# Patient Record
Sex: Female | Born: 1960 | Race: White | Hispanic: No | Smoking: Former smoker
Health system: Southern US, Community
[De-identification: ages and names within clinical notes are randomized; demographics above are authoritative.]

## PROBLEM LIST (undated history)

## (undated) DIAGNOSIS — E7800 Pure hypercholesterolemia, unspecified: Secondary | ICD-10-CM

## (undated) DIAGNOSIS — K219 Gastro-esophageal reflux disease without esophagitis: Secondary | ICD-10-CM

## (undated) DIAGNOSIS — I10 Essential (primary) hypertension: Secondary | ICD-10-CM

## (undated) DIAGNOSIS — L509 Urticaria, unspecified: Secondary | ICD-10-CM

## (undated) DIAGNOSIS — M1990 Unspecified osteoarthritis, unspecified site: Secondary | ICD-10-CM

## (undated) DIAGNOSIS — Z889 Allergy status to unspecified drugs, medicaments and biological substances status: Secondary | ICD-10-CM

## (undated) DIAGNOSIS — Z8489 Family history of other specified conditions: Secondary | ICD-10-CM

## (undated) HISTORY — DX: Essential (primary) hypertension: I10

## (undated) HISTORY — DX: Urticaria, unspecified: L50.9

## (undated) HISTORY — PX: CYSTECTOMY: SUR359

## (undated) HISTORY — DX: Pure hypercholesterolemia, unspecified: E78.00

## (undated) HISTORY — DX: Gastro-esophageal reflux disease without esophagitis: K21.9

## (undated) HISTORY — PX: CLAVICLE SURGERY: SHX598

## (undated) HISTORY — PX: TUBAL LIGATION: SHX77

## (undated) HISTORY — PX: SINOSCOPY: SHX187

---

## 1980-01-26 HISTORY — PX: TUBAL LIGATION: SHX77

## 2014-11-14 ENCOUNTER — Ambulatory Visit: Payer: Self-pay | Admitting: Allergy and Immunology

## 2015-03-04 ENCOUNTER — Ambulatory Visit (INDEPENDENT_AMBULATORY_CARE_PROVIDER_SITE_OTHER): Payer: BLUE CROSS/BLUE SHIELD | Admitting: Allergy and Immunology

## 2015-03-04 ENCOUNTER — Encounter: Payer: Self-pay | Admitting: Allergy and Immunology

## 2015-03-04 VITALS — BP 104/60 | HR 94 | Temp 97.6°F | Resp 18 | Ht 67.87 in | Wt 197.0 lb

## 2015-03-04 DIAGNOSIS — J310 Chronic rhinitis: Secondary | ICD-10-CM

## 2015-03-04 DIAGNOSIS — R059 Cough, unspecified: Secondary | ICD-10-CM

## 2015-03-04 DIAGNOSIS — L299 Pruritus, unspecified: Secondary | ICD-10-CM | POA: Diagnosis not present

## 2015-03-04 DIAGNOSIS — R05 Cough: Secondary | ICD-10-CM

## 2015-03-04 MED ORDER — HYDROXYZINE HCL 10 MG PO TABS: 10.0000 mg | ORAL_TABLET | Freq: Every day | ORAL | Status: DC

## 2015-03-04 MED ORDER — FLUTICASONE PROPIONATE 50 MCG/ACT NA SUSP
NASAL | Status: DC
Start: 2015-03-04 — End: 2015-09-01

## 2015-03-04 NOTE — Patient Instructions (Addendum)
Take Home Sheet  1. Avoidance: of fragranced soaps/lotions/detergents.   2. Antihistamine: Zyrtec 10mg  by mouth once daily for runny nose or itching.   If needed add Atarax 10mg  at bedtime for itching.  3. Nasal Spray: Flonase one spray(s) each nostril once daily for stuffy nose or drainage.   4.  Moisturize skin 2-4 times daily with Vanicream or Cerave cream (tub).   Consider squeezable vaseline.   5. Nasal Saline wash each evening at shower time.  6.  Keep written diary of increased itching ---environment, exposure, ingestion and activity.  7. Follow up Visit: for skin testing off antihistamines (Zyrtec, Benadryl, Atarax) 72 hours prior to appointment.   Websites that have reliable Patient information: 1. American Academy of Asthma, Allergy, & Immunology: www.aaaai.org 2. Food Allergy Network: www.foodallergy.org 3. Mothers of Asthmatics: www.aanma.org 4. [employer]: DiningCalendar.de 5. American College of Allergy, Asthma, & Immunology: https://robertson.info/ or www.acaai.org

## 2015-03-04 NOTE — Progress Notes (Signed)
NEW PATIENT NOTE  RE: Deborah Walter MRN: [id number] DOB: Nov 09, 1960 [facility] [address] [place], [place] [address] Date of Office Visit: 03/04/2015  Dear Shanon Brow B. Scotty Court, MD:  I had the pleasure of seeing Deborah Walter today in initial evaluation as you recall-- Subjective:  Deborah Walter is a 55 y.o. female who presents today for Pruritis and Nasal Congestion  Assessment:   1. Cough associated with chronic rhinitis/postnasal drip, suspected allergic etiology.  2. Recent pruritus associated with rash and significant xerosis.  3. Question of allergic reaction with clear, skin today.  4.      Current avoidance of amoxicillin, though less likely hypersensitivity. 5.      Patient report of sinusitis episodes. Plan:   Meds ordered this encounter  Medications  . hydrOXYzine (ATARAX/VISTARIL) 10 MG tablet    Sig: Take 1 tablet (10 mg total) by mouth daily.    Dispense:  30 tablet    Refill:  3  . fluticasone (FLONASE) 50 MCG/ACT nasal spray    Sig: Use one to two sprays each nostril once daily.    Dispense:  16 g    Refill:  5   Patient Instructions  1. Avoidance: of fragranced soaps/lotions/detergents. 2. Antihistamine: Zyrtec 10mg  by mouth once daily for runny nose or itching.   If needed add Atarax 10mg  at bedtime for itching. 3. Nasal Spray: Flonase one spray(s) each nostril once daily for stuffy nose or drainage.  4.  Moisturize skin 2-4 times daily with Vanicream or Cerave cream (tub).   Consider squeezable vaseline. 5. Nasal Saline wash each evening at shower time. 6.  Keep written diary of increased itching ---environment, exposure, ingestion and activity. 7. Follow up Visit: for skin testing off antihistamines (Zyrtec, Benadryl, Atarax) 72 hours prior to appointment and obtain records of sinus CT scan.  HPI: Deborah Walter presents with concerns for allergies.  She describes recurring history of rhinorrhea, congestion, sneezing, postnasal drip,  intermittently associated with cough and possible rare shortness of breath, though none currently.  She denies difficulty breathing, chest congestion or tightness or history of recurring chest symptoms.  She has a great concern for skin difficulties since late July when she completed a course of amoxicillin (Augmentin) for sinus infection.  Within 36 hours, off medication, she began with increasing itching, redness, swelling at ears, facial irritation and therefore was evaluated by her primary care physician who administered prednisone course.  She followed up 2 days later because of persisting difficulty and received cortisone injection and took Benadryl.  She also decided to manage knee discomfort with Advil and actually thought that this resolved her skin concerns.  She denies associated swelling of her lips, tongue and throat nor associated dysphagia, difficulty breathing or shortness of breath.  She typically uses Dove soap and Gain detergent but has changed since her skin concerns to Aveeno soap/lotion, and Charlie's detergent.  She has no restrictions to her diet and often is a sandwich person--hamburger, fries, steak and cheese, pepperoni mushroom pizza,Italian pepperoni and salami subs.  She has no recollection of tick bites.  She does recall a sinus CT scan 4 years ago and thought there was sinus abnormalities.  Denies exercise induced difficulty, nocturnal concerns or missed work.  She describes headache, difficulty associated with peanut and almond exposures-- therefore avoidance.  Denies ED or Urgent care visits.  Medical History: Past Medical History  Diagnosis Date  . Hypertension   . Elevated cholesterol   . Acid reflux  Surgical History: Past Surgical History  Procedure Laterality Date  . Clavicle surgery Right Early 1990  . Cystectomy  Early 1990    Bottom of spine   Family History: Family History  Problem Relation Age of Onset  . Asthma Father   . Asthma Sister   . Allergic  rhinitis Neg Hx   . Eczema Neg Hx   . Immunodeficiency Neg Hx   . Urticaria Neg Hx    Social History: Social History  . Marital Status: Single    Spouse Name: N/A  . Number of Children: 2  . Years of Education: N/A   Social History Main Topics  . Smoking status: Former Smoker    Quit date: 09/14/2014  . Smokeless tobacco: Never Used  . Alcohol Use: Not on file  . Drug Use: Not on file  . Sexual Activity: Not on file   Social History Narrative  .  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  History of 2 [employer] and divorced.     Deborah Walter has a current medication list which includes the following prescription(s): fluticasone, hydroxyzine, lisinopril-hydrochlorothiazide, lovastatin, oxycodone-acetaminophen, and ranitidine.   Drug Allergies: Allergies  Allergen Reactions  . Peanut-Containing Drug Products     Headaches  . Amoxicillin Swelling    Facial swelling and severe itching  . Other     ALMONDS.  CAUSE MIGRAINE HEADACHE   Environmental History: Deborah Walter lives in a 57 year old house 432 years, with wood floors, central air and heat dogs and cats.  Stuffed mattress non-feather pillow and comforter without humidifier, or smokers.  Review of Systems  Constitutional: Negative for fever, weight loss and malaise/fatigue.  HENT: Positive for congestion. Negative for ear pain, hearing loss, nosebleeds and sore throat.        See HPI.   Eyes: Negative for blurred vision, double vision, discharge and redness.       Corrective eyeglasses lenses for 10 years.  Respiratory: Negative for shortness of breath.        Denies history of bronchitis and pneumonia.  Gastrointestinal: Negative for heartburn, nausea, vomiting, abdominal pain, diarrhea and constipation.  Genitourinary: Negative.  Negative for hematuria.  Musculoskeletal: Negative for myalgias and joint pain.  Skin: Negative for itching and rash.       See HPI,  currently symptom-free.  Neurological: Negative.  Negative for dizziness, seizures, weakness and headaches.  Endo/Heme/Allergies: Positive for environmental allergies.       Denies sensitivity to aspirin, NSAIDs, stinging insects, latex, jewelry and cosmetics.    Objective:   Filed Vitals:   03/04/15 0846  BP: 104/60  Pulse: 94  Temp: 97.6 F (36.4 C)  Resp: 18   SpO2 Readings from Last 1 Encounters:  03/04/15 97%   Physical Exam  Constitutional: She is well-developed, well-nourished, and in no distress.  HENT:  Head: Atraumatic.  Right Ear: Tympanic membrane and ear canal normal.  Left Ear: Tympanic membrane and ear canal normal.  Nose: Mucosal edema present. No rhinorrhea. No epistaxis.  Mouth/Throat: Oropharynx is clear and moist and mucous membranes are normal. No oropharyngeal exudate, posterior oropharyngeal edema or posterior oropharyngeal erythema.  Eyes: Conjunctivae are normal.  Neck: Neck supple.  Cardiovascular: Normal rate, S1 normal and S2 normal.   No murmur heard. Pulmonary/Chest: Effort normal. She has no wheezes. She has no rhonchi. She has no rales.  Abdominal: Soft. Normal appearance and bowel sounds are normal.  Musculoskeletal: She exhibits no edema.  Lymphadenopathy:    She has no cervical adenopathy.  Neurological: She is alert.  Skin: Skin is warm and intact. No rash noted. No cyanosis. Nails show no clubbing.  Generalized dryness without dermatographia.   Diagnostics: Spirometry:  FVC 3.40--103%, FEV1 2.83--101%. Skin testing:  Deferred.     M. Ishmael Holter, MD   cc: Deloria Lair, MD

## 2015-03-05 ENCOUNTER — Encounter: Payer: Self-pay | Admitting: Allergy and Immunology

## 2015-04-04 ENCOUNTER — Other Ambulatory Visit: Payer: Self-pay

## 2015-04-04 MED ORDER — HYDROXYZINE HCL 10 MG PO TABS: 10.0000 mg | ORAL_TABLET | Freq: Every day | ORAL | Status: DC

## 2015-04-24 ENCOUNTER — Ambulatory Visit (INDEPENDENT_AMBULATORY_CARE_PROVIDER_SITE_OTHER): Payer: BLUE CROSS/BLUE SHIELD | Admitting: Otolaryngology

## 2015-04-24 DIAGNOSIS — J320 Chronic maxillary sinusitis: Secondary | ICD-10-CM

## 2015-04-24 DIAGNOSIS — J321 Chronic frontal sinusitis: Secondary | ICD-10-CM | POA: Diagnosis not present

## 2015-04-24 DIAGNOSIS — J342 Deviated nasal septum: Secondary | ICD-10-CM

## 2015-04-25 ENCOUNTER — Other Ambulatory Visit (INDEPENDENT_AMBULATORY_CARE_PROVIDER_SITE_OTHER): Payer: Self-pay | Admitting: Otolaryngology

## 2015-04-25 DIAGNOSIS — J328 Other chronic sinusitis: Secondary | ICD-10-CM

## 2015-04-30 ENCOUNTER — Ambulatory Visit (HOSPITAL_COMMUNITY): Admission: RE | Admit: 2015-04-30 | Payer: BLUE CROSS/BLUE SHIELD | Source: Ambulatory Visit

## 2015-05-26 ENCOUNTER — Ambulatory Visit (HOSPITAL_COMMUNITY): Payer: BLUE CROSS/BLUE SHIELD

## 2015-06-03 ENCOUNTER — Ambulatory Visit (HOSPITAL_COMMUNITY)
Admission: RE | Admit: 2015-06-03 | Discharge: 2015-06-03 | Disposition: A | Discharge status: Home / Self Care | Payer: BLUE CROSS/BLUE SHIELD | Source: Ambulatory Visit | Attending: Otolaryngology | Admitting: Otolaryngology

## 2015-06-03 ENCOUNTER — Ambulatory Visit: Payer: BLUE CROSS/BLUE SHIELD | Admitting: Allergy and Immunology

## 2015-06-03 DIAGNOSIS — M272 Inflammatory conditions of jaws: Secondary | ICD-10-CM | POA: Insufficient documentation

## 2015-06-03 DIAGNOSIS — K0889 Other specified disorders of teeth and supporting structures: Secondary | ICD-10-CM | POA: Diagnosis not present

## 2015-06-03 DIAGNOSIS — J320 Chronic maxillary sinusitis: Secondary | ICD-10-CM | POA: Diagnosis not present

## 2015-06-03 DIAGNOSIS — J328 Other chronic sinusitis: Secondary | ICD-10-CM | POA: Diagnosis present

## 2015-06-03 DIAGNOSIS — J342 Deviated nasal septum: Secondary | ICD-10-CM | POA: Diagnosis not present

## 2015-06-05 ENCOUNTER — Ambulatory Visit (INDEPENDENT_AMBULATORY_CARE_PROVIDER_SITE_OTHER): Payer: BLUE CROSS/BLUE SHIELD | Admitting: Otolaryngology

## 2015-06-05 DIAGNOSIS — J320 Chronic maxillary sinusitis: Secondary | ICD-10-CM

## 2015-06-05 DIAGNOSIS — J342 Deviated nasal septum: Secondary | ICD-10-CM

## 2015-06-05 DIAGNOSIS — J322 Chronic ethmoidal sinusitis: Secondary | ICD-10-CM | POA: Diagnosis not present

## 2015-06-05 DIAGNOSIS — J321 Chronic frontal sinusitis: Secondary | ICD-10-CM | POA: Diagnosis not present

## 2015-07-18 ENCOUNTER — Other Ambulatory Visit: Payer: Self-pay

## 2015-07-18 ENCOUNTER — Telehealth: Payer: Self-pay | Admitting: Allergy and Immunology

## 2015-07-18 MED ORDER — HYDROXYZINE HCL 10 MG PO TABS: 10.0000 mg | ORAL_TABLET | Freq: Every day | ORAL | Status: DC

## 2015-07-18 NOTE — Telephone Encounter (Signed)
Sent in hydroxazine

## 2015-07-18 NOTE — Telephone Encounter (Signed)
Pt called and said that she needed to have the rx for the itching, she does not rember the name of it [facility].  [phone].

## 2015-07-24 ENCOUNTER — Other Ambulatory Visit: Payer: Self-pay | Admitting: Otolaryngology

## 2015-08-26 ENCOUNTER — Encounter (HOSPITAL_BASED_OUTPATIENT_CLINIC_OR_DEPARTMENT_OTHER): Payer: Self-pay | Admitting: *Deleted

## 2015-08-29 ENCOUNTER — Other Ambulatory Visit: Payer: Self-pay

## 2015-08-29 ENCOUNTER — Encounter (HOSPITAL_COMMUNITY)
Admission: RE | Admit: 2015-08-29 | Discharge: 2015-08-29 | Disposition: A | Discharge status: Home / Self Care | Payer: BLUE CROSS/BLUE SHIELD | Source: Ambulatory Visit | Attending: Otolaryngology | Admitting: Otolaryngology

## 2015-08-29 DIAGNOSIS — Z6830 Body mass index (BMI) 30.0-30.9, adult: Secondary | ICD-10-CM | POA: Diagnosis not present

## 2015-08-29 DIAGNOSIS — B49 Unspecified mycosis: Secondary | ICD-10-CM | POA: Diagnosis not present

## 2015-08-29 DIAGNOSIS — J342 Deviated nasal septum: Secondary | ICD-10-CM | POA: Diagnosis present

## 2015-08-29 DIAGNOSIS — J3489 Other specified disorders of nose and nasal sinuses: Secondary | ICD-10-CM | POA: Diagnosis not present

## 2015-08-29 DIAGNOSIS — I10 Essential (primary) hypertension: Secondary | ICD-10-CM | POA: Diagnosis not present

## 2015-08-29 DIAGNOSIS — J343 Hypertrophy of nasal turbinates: Secondary | ICD-10-CM | POA: Diagnosis not present

## 2015-08-29 DIAGNOSIS — Z87891 Personal history of nicotine dependence: Secondary | ICD-10-CM | POA: Diagnosis not present

## 2015-08-29 DIAGNOSIS — E669 Obesity, unspecified: Secondary | ICD-10-CM | POA: Diagnosis not present

## 2015-08-29 DIAGNOSIS — K219 Gastro-esophageal reflux disease without esophagitis: Secondary | ICD-10-CM | POA: Diagnosis not present

## 2015-08-29 DIAGNOSIS — Z79899 Other long term (current) drug therapy: Secondary | ICD-10-CM | POA: Diagnosis not present

## 2015-08-29 DIAGNOSIS — J328 Other chronic sinusitis: Secondary | ICD-10-CM | POA: Diagnosis not present

## 2015-08-29 LAB — BASIC METABOLIC PANEL
Anion gap: 7 (ref 5–15)
BUN: 44 mg/dL — ABNORMAL HIGH (ref 6–20)
CALCIUM: 9.5 mg/dL (ref 8.9–10.3)
CO2: 25 mmol/L (ref 22–32)
CREATININE: 1.04 mg/dL — AB (ref 0.44–1.00)
Chloride: 104 mmol/L (ref 101–111)
GFR calc Af Amer: >60 mL/min (ref >60)
GFR, EST NON AFRICAN AMERICAN: 59 mL/min — AB (ref >60)
GLUCOSE: 105 mg/dL — AB (ref 65–99)
Potassium: 4.3 mmol/L (ref 3.5–5.1)
Sodium: 136 mmol/L (ref 135–145)

## 2015-08-29 NOTE — Progress Notes (Signed)
Abnormal lad reviewed by Dr Rodman Comp   No further treatment needed

## 2015-09-01 ENCOUNTER — Encounter (HOSPITAL_BASED_OUTPATIENT_CLINIC_OR_DEPARTMENT_OTHER)
Admission: RE | Disposition: A | Discharge status: Home / Self Care | Payer: Self-pay | Source: Ambulatory Visit | Attending: Otolaryngology

## 2015-09-01 ENCOUNTER — Ambulatory Visit (HOSPITAL_BASED_OUTPATIENT_CLINIC_OR_DEPARTMENT_OTHER): Payer: BLUE CROSS/BLUE SHIELD | Admitting: Anesthesiology

## 2015-09-01 ENCOUNTER — Ambulatory Visit (HOSPITAL_BASED_OUTPATIENT_CLINIC_OR_DEPARTMENT_OTHER)
Admission: RE | Admit: 2015-09-01 | Discharge: 2015-09-01 | Disposition: A | Discharge status: Home / Self Care | Payer: BLUE CROSS/BLUE SHIELD | Source: Ambulatory Visit | Attending: Otolaryngology | Admitting: Otolaryngology

## 2015-09-01 ENCOUNTER — Encounter (HOSPITAL_BASED_OUTPATIENT_CLINIC_OR_DEPARTMENT_OTHER): Payer: Self-pay | Admitting: *Deleted

## 2015-09-01 DIAGNOSIS — J342 Deviated nasal septum: Secondary | ICD-10-CM | POA: Diagnosis not present

## 2015-09-01 DIAGNOSIS — J328 Other chronic sinusitis: Secondary | ICD-10-CM | POA: Insufficient documentation

## 2015-09-01 DIAGNOSIS — J343 Hypertrophy of nasal turbinates: Secondary | ICD-10-CM | POA: Diagnosis not present

## 2015-09-01 DIAGNOSIS — B49 Unspecified mycosis: Secondary | ICD-10-CM | POA: Insufficient documentation

## 2015-09-01 DIAGNOSIS — I10 Essential (primary) hypertension: Secondary | ICD-10-CM | POA: Insufficient documentation

## 2015-09-01 DIAGNOSIS — Z79899 Other long term (current) drug therapy: Secondary | ICD-10-CM | POA: Insufficient documentation

## 2015-09-01 DIAGNOSIS — Z6830 Body mass index (BMI) 30.0-30.9, adult: Secondary | ICD-10-CM | POA: Insufficient documentation

## 2015-09-01 DIAGNOSIS — J322 Chronic ethmoidal sinusitis: Secondary | ICD-10-CM | POA: Diagnosis not present

## 2015-09-01 DIAGNOSIS — E669 Obesity, unspecified: Secondary | ICD-10-CM | POA: Insufficient documentation

## 2015-09-01 DIAGNOSIS — J320 Chronic maxillary sinusitis: Secondary | ICD-10-CM | POA: Diagnosis not present

## 2015-09-01 DIAGNOSIS — Z87891 Personal history of nicotine dependence: Secondary | ICD-10-CM | POA: Insufficient documentation

## 2015-09-01 DIAGNOSIS — J321 Chronic frontal sinusitis: Secondary | ICD-10-CM | POA: Diagnosis not present

## 2015-09-01 DIAGNOSIS — J3489 Other specified disorders of nose and nasal sinuses: Secondary | ICD-10-CM | POA: Insufficient documentation

## 2015-09-01 DIAGNOSIS — K219 Gastro-esophageal reflux disease without esophagitis: Secondary | ICD-10-CM | POA: Insufficient documentation

## 2015-09-01 DIAGNOSIS — J338 Other polyp of sinus: Secondary | ICD-10-CM | POA: Diagnosis not present

## 2015-09-01 HISTORY — PX: NASAL SEPTOPLASTY W/ TURBINOPLASTY: SHX2070

## 2015-09-01 HISTORY — PX: SINUS ENDO WITH FUSION: SHX5329

## 2015-09-01 SURGERY — SURGERY, PARANASAL SINUS, ENDOSCOPIC, WITH NASAL SEPTOPLASTY, TURBINOPLASTY, AND MAXILLARY SINUSOTOMY
Anesthesia: General | Site: Nose | Laterality: Left

## 2015-09-01 MED ORDER — LIDOCAINE-EPINEPHRINE 1 %-1:100000 IJ SOLN
INTRAMUSCULAR | Status: AC
  Filled 2015-09-01: qty 2

## 2015-09-01 MED ORDER — ARTIFICIAL TEARS OP OINT
TOPICAL_OINTMENT | OPHTHALMIC | Status: AC
  Filled 2015-09-01: qty 3.5

## 2015-09-01 MED ORDER — MUPIROCIN 2 % EX OINT
TOPICAL_OINTMENT | CUTANEOUS | Status: DC | PRN
  Administered 2015-09-01: 1 via NASAL

## 2015-09-01 MED ORDER — DEXAMETHASONE SODIUM PHOSPHATE 4 MG/ML IJ SOLN
INTRAMUSCULAR | Status: DC | PRN
  Administered 2015-09-01: 10 mg via INTRAVENOUS

## 2015-09-01 MED ORDER — ONDANSETRON HCL 4 MG/2ML IJ SOLN
INTRAMUSCULAR | Status: AC
  Filled 2015-09-01: qty 2

## 2015-09-01 MED ORDER — ROCURONIUM BROMIDE 10 MG/ML (PF) SYRINGE
PREFILLED_SYRINGE | INTRAVENOUS | Status: DC | PRN
  Administered 2015-09-01: 10 mg via INTRAVENOUS
  Administered 2015-09-01: 30 mg via INTRAVENOUS

## 2015-09-01 MED ORDER — MIDAZOLAM HCL 2 MG/2ML IJ SOLN
INTRAMUSCULAR | Status: AC
  Filled 2015-09-01: qty 2

## 2015-09-01 MED ORDER — LIDOCAINE 2% (20 MG/ML) 5 ML SYRINGE
INTRAMUSCULAR | Status: DC | PRN
  Administered 2015-09-01: 80 mg via INTRAVENOUS

## 2015-09-01 MED ORDER — MUPIROCIN 2 % EX OINT
TOPICAL_OINTMENT | CUTANEOUS | Status: AC
  Filled 2015-09-01: qty 22

## 2015-09-01 MED ORDER — PHENYLEPHRINE 40 MCG/ML (10ML) SYRINGE FOR IV PUSH (FOR BLOOD PRESSURE SUPPORT)
PREFILLED_SYRINGE | INTRAVENOUS | Status: DC | PRN
  Administered 2015-09-01 (×2): 80 ug via INTRAVENOUS

## 2015-09-01 MED ORDER — PROPOFOL 10 MG/ML IV BOLUS
INTRAVENOUS | Status: AC
  Filled 2015-09-01: qty 20

## 2015-09-01 MED ORDER — CLINDAMYCIN PHOSPHATE 900 MG/50ML IV SOLN
INTRAVENOUS | Status: DC | PRN
  Administered 2015-09-01: 900 mg via INTRAVENOUS

## 2015-09-01 MED ORDER — SUCCINYLCHOLINE CHLORIDE 20 MG/ML IJ SOLN
INTRAMUSCULAR | Status: DC | PRN
  Administered 2015-09-01: 140 mg via INTRAVENOUS

## 2015-09-01 MED ORDER — CLINDAMYCIN PHOSPHATE 900 MG/50ML IV SOLN
INTRAVENOUS | Status: AC
  Filled 2015-09-01: qty 50

## 2015-09-01 MED ORDER — PROPOFOL 10 MG/ML IV BOLUS
INTRAVENOUS | Status: DC | PRN
  Administered 2015-09-01: 180 mg via INTRAVENOUS

## 2015-09-01 MED ORDER — OXYMETAZOLINE HCL 0.05 % NA SOLN
NASAL | Status: AC
  Filled 2015-09-01: qty 30

## 2015-09-01 MED ORDER — PROMETHAZINE HCL 25 MG/ML IJ SOLN: 6.2500 mg | INTRAMUSCULAR | Status: DC | PRN

## 2015-09-01 MED ORDER — FENTANYL CITRATE (PF) 100 MCG/2ML IJ SOLN: 25.0000 ug | INTRAMUSCULAR | Status: DC | PRN

## 2015-09-01 MED ORDER — BACITRACIN ZINC 500 UNIT/GM EX OINT
TOPICAL_OINTMENT | CUTANEOUS | Status: AC
  Filled 2015-09-01: qty 28.35

## 2015-09-01 MED ORDER — SUCCINYLCHOLINE CHLORIDE 200 MG/10ML IV SOSY
PREFILLED_SYRINGE | INTRAVENOUS | Status: AC
  Filled 2015-09-01: qty 10

## 2015-09-01 MED ORDER — BACITRACIN ZINC 500 UNIT/GM EX OINT
TOPICAL_OINTMENT | CUTANEOUS | Status: AC
  Filled 2015-09-01: qty 0.9

## 2015-09-01 MED ORDER — LACTATED RINGERS IV SOLN
INTRAVENOUS | Status: DC
  Administered 2015-09-01: 10:00:00 via INTRAVENOUS
  Administered 2015-09-01: 10 mL/h via INTRAVENOUS

## 2015-09-01 MED ORDER — OXYMETAZOLINE HCL 0.05 % NA SOLN
NASAL | Status: DC | PRN
  Administered 2015-09-01: 1 via TOPICAL

## 2015-09-01 MED ORDER — CLINDAMYCIN PHOSPHATE 300 MG/50ML IV SOLN
INTRAVENOUS | Status: AC
  Filled 2015-09-01: qty 50

## 2015-09-01 MED ORDER — FENTANYL CITRATE (PF) 100 MCG/2ML IJ SOLN
INTRAMUSCULAR | Status: AC
Start: 2015-09-01 — End: 2015-09-01
  Filled 2015-09-01: qty 2

## 2015-09-01 MED ORDER — SUGAMMADEX SODIUM 200 MG/2ML IV SOLN
INTRAVENOUS | Status: DC | PRN
  Administered 2015-09-01: 200 mg via INTRAVENOUS

## 2015-09-01 MED ORDER — SUGAMMADEX SODIUM 200 MG/2ML IV SOLN
INTRAVENOUS | Status: AC
  Filled 2015-09-01: qty 2

## 2015-09-01 MED ORDER — CIPROFLOXACIN-FLUOCINOLONE PF 0.3-0.025 % OT SOLN
OTIC | Status: AC
  Filled 2015-09-01: qty 0.25

## 2015-09-01 MED ORDER — SCOPOLAMINE 1 MG/3DAYS TD PT72: 1.0000 | MEDICATED_PATCH | Freq: Once | TRANSDERMAL | Status: DC | PRN

## 2015-09-01 MED ORDER — FENTANYL CITRATE (PF) 100 MCG/2ML IJ SOLN
50.0000 ug | INTRAMUSCULAR | Status: AC | PRN
  Administered 2015-09-01: 100 ug via INTRAVENOUS
  Administered 2015-09-01: 25 ug via INTRAVENOUS
  Administered 2015-09-01: 50 ug via INTRAVENOUS
  Administered 2015-09-01: 25 ug via INTRAVENOUS

## 2015-09-01 MED ORDER — LIDOCAINE-EPINEPHRINE 1 %-1:100000 IJ SOLN
INTRAMUSCULAR | Status: DC | PRN
  Administered 2015-09-01: 3 mL

## 2015-09-01 MED ORDER — ROCURONIUM BROMIDE 100 MG/10ML IV SOLN: INTRAVENOUS | Status: DC | PRN

## 2015-09-01 MED ORDER — OXYCODONE-ACETAMINOPHEN 5-325 MG PO TABS: 1.0000 | ORAL_TABLET | ORAL | 0 refills | Status: DC | PRN

## 2015-09-01 MED ORDER — GLYCOPYRROLATE 0.2 MG/ML IJ SOLN: 0.2000 mg | Freq: Once | INTRAMUSCULAR | Status: DC | PRN

## 2015-09-01 MED ORDER — CLINDAMYCIN HCL 300 MG PO CAPS
300.0000 mg | ORAL_CAPSULE | Freq: Three times a day (TID) | ORAL | 0 refills | Status: AC
Start: 2015-09-01 — End: 2015-09-06

## 2015-09-01 MED ORDER — FENTANYL CITRATE (PF) 100 MCG/2ML IJ SOLN
INTRAMUSCULAR | Status: AC
  Filled 2015-09-01: qty 2

## 2015-09-01 MED ORDER — MIDAZOLAM HCL 2 MG/2ML IJ SOLN
1.0000 mg | INTRAMUSCULAR | Status: DC | PRN
  Administered 2015-09-01: 2 mg via INTRAVENOUS

## 2015-09-01 MED ORDER — DEXAMETHASONE SODIUM PHOSPHATE 10 MG/ML IJ SOLN
INTRAMUSCULAR | Status: AC
  Filled 2015-09-01: qty 1

## 2015-09-01 MED ORDER — LIDOCAINE 2% (20 MG/ML) 5 ML SYRINGE
INTRAMUSCULAR | Status: AC
  Filled 2015-09-01: qty 5

## 2015-09-01 SURGICAL SUPPLY — 56 items
ATTRACTOMAT 16X20 MAGNETIC DRP (DRAPES) IMPLANT
BLADE ROTATE RAD 12 4 M4 (BLADE) IMPLANT
BLADE ROTATE RAD 12 4MM M4 (BLADE)
BLADE ROTATE RAD 40 4 M4 (BLADE) IMPLANT
BLADE ROTATE RAD 40 4MM M4 (BLADE)
BLADE ROTATE TRICUT 4MX13CM M4 (BLADE) ×1
BLADE ROTATE TRICUT 4X13 M4 (BLADE) ×3 IMPLANT
BLADE TRICUT ROTATE M4 4 5PK (BLADE) IMPLANT
BLADE TRICUT ROTATE M4 4MM 5PK (BLADE)
BUR HS RAD FRONTAL 3 (BURR) IMPLANT
BUR HS RAD FRONTAL 3MM (BURR)
CANISTER SUC SOCK COL 7IN (MISCELLANEOUS) ×4 IMPLANT
CANISTER SUCT 1200ML W/VALVE (MISCELLANEOUS) ×4 IMPLANT
COAGULATOR SUCT 8FR VV (MISCELLANEOUS) ×4 IMPLANT
DECANTER SPIKE VIAL GLASS SM (MISCELLANEOUS) IMPLANT
DRSG NASAL KENNEDY LMNT 8CM (GAUZE/BANDAGES/DRESSINGS) IMPLANT
DRSG NASOPORE 8CM (GAUZE/BANDAGES/DRESSINGS) IMPLANT
DRSG TELFA 3X8 NADH (GAUZE/BANDAGES/DRESSINGS) IMPLANT
ELECT REM PT RETURN 9FT ADLT (ELECTROSURGICAL) ×4
ELECTRODE REM PT RTRN 9FT ADLT (ELECTROSURGICAL) ×2 IMPLANT
GLOVE BIO SURGEON STRL SZ7.5 (GLOVE) ×4 IMPLANT
GLOVE SURG SS PI 7.0 STRL IVOR (GLOVE) ×4 IMPLANT
GOWN STRL REUS W/ TWL LRG LVL3 (GOWN DISPOSABLE) ×4 IMPLANT
GOWN STRL REUS W/TWL LRG LVL3 (GOWN DISPOSABLE) ×4
HEMOSTAT SURGICEL 2X14 (HEMOSTASIS) IMPLANT
IV NS 1000ML (IV SOLUTION)
IV NS 1000ML BAXH (IV SOLUTION) IMPLANT
IV NS 500ML (IV SOLUTION) ×4
IV NS 500ML BAXH (IV SOLUTION) ×4 IMPLANT
NEEDLE HYPO 25X1 1.5 SAFETY (NEEDLE) ×4 IMPLANT
NEEDLE SPNL 25GX3.5 QUINCKE BL (NEEDLE) IMPLANT
NS IRRIG 1000ML POUR BTL (IV SOLUTION) ×4 IMPLANT
PACK BASIN DAY SURGERY FS (CUSTOM PROCEDURE TRAY) ×4 IMPLANT
PACK ENT DAY SURGERY (CUSTOM PROCEDURE TRAY) ×4 IMPLANT
PACKING NASAL EPIS 4X2.4 XEROG (MISCELLANEOUS) ×4 IMPLANT
SLEEVE SCD COMPRESS KNEE MED (MISCELLANEOUS) ×4 IMPLANT
SOLUTION BUTLER CLEAR DIP (MISCELLANEOUS) ×8 IMPLANT
SPLINT NASAL AIRWAY SILICONE (MISCELLANEOUS) IMPLANT
SPONGE GAUZE 2X2 8PLY STER LF (GAUZE/BANDAGES/DRESSINGS) ×1
SPONGE GAUZE 2X2 8PLY STRL LF (GAUZE/BANDAGES/DRESSINGS) ×3 IMPLANT
SPONGE NEURO XRAY DETECT 1X3 (DISPOSABLE) ×4 IMPLANT
SUT CHROMIC 4 0 P 3 18 (SUTURE) ×4 IMPLANT
SUT PLAIN 4 0 ~~LOC~~ 1 (SUTURE) ×4 IMPLANT
SUT PROLENE 3 0 PS 2 (SUTURE) ×4 IMPLANT
SUT VIC AB 4-0 P-3 18XBRD (SUTURE) IMPLANT
SUT VIC AB 4-0 P3 18 (SUTURE)
SYR 50ML LL SCALE MARK (SYRINGE) ×4 IMPLANT
TOWEL OR 17X24 6PK STRL BLUE (TOWEL DISPOSABLE) ×4 IMPLANT
TRACKER ENT INSTRUMENT (MISCELLANEOUS) ×4 IMPLANT
TRACKER ENT PATIENT (MISCELLANEOUS) ×8 IMPLANT
TUBE CONNECTING 20'X1/4 (TUBING) ×1
TUBE CONNECTING 20X1/4 (TUBING) ×3 IMPLANT
TUBE SALEM SUMP 12R W/ARV (TUBING) IMPLANT
TUBE SALEM SUMP 16 FR W/ARV (TUBING) ×8 IMPLANT
TUBING STRAIGHTSHOT EPS 5PK (TUBING) ×4 IMPLANT
YANKAUER SUCT BULB TIP NO VENT (SUCTIONS) ×4 IMPLANT

## 2015-09-01 NOTE — H&P (Signed)
Cc: Chronic rhinosinusitis, chronic nasal obstruction  HPI: The patient is a 55 year old female who returns today for her follow-up evaluation.  She was last seen more than 1 month ago.  At that time, she was noted to have chronic left-sided rhinosinusitis. Her left middle meatus was edematous, and obstructed with polypoid tissue. She was treated with 12 days of prednisone.  She underwent a repeat sinus CT scan. The CT showed severe opacification of her left ethmoid sinus, maxillary sinus and left frontal recess. Severe rightward nasal septal deviation and bilateral inferior turbinate hypertrophy were also noted.  The patient returns today complaining of persistent nasal congestion.  She is having difficulty breathing through her nose.  She also complains of frequent left-sided facial pressure.   Exam: The nasal cavities were decongested and anesthetised with a combination of oxymetazoline and 4% lidocaine solution.  The flexible scope was inserted into the right nasal cavity.  Endoscopy of the inferior and middle meatus was performed.  Edematous mucosa was noted.  No polyp, mass, or lesion was appreciated on the right side. NSD noted.  Olfactory cleft was clear.  Nasopharynx was clear.  Turbinates were hypertrophied but without mass.  Incomplete response to decongestion.  The procedure was repeated on the contralateral side with similar findings. The left middle meatus was obstructed with polypoid tissue.  The patient tolerated the procedure well.  Instructions were given to avoid eating or drinking for 2 hours.    Assessment: 1.  Chronic left maxillary, ethmoid, and frontal rhinosinusitis.  Her left middle meatus is obstructed with polypoid tissue.  2.  The patient also has severe nasal septal deviation and bilateral inferior turbinate hypertrophy, causing chronic bilateral nasal obstruction.   Plan: 1.  The nasal endoscopy findings and the CT images are reviewed with the patient.  2.  Based on the  chronic nature of her symptoms, she may benefit from undergoing surgical intervention with septoplasty, turbinate reduction, and left endoscopic sinus surgery.  The risks, benefits, alternatives and details of the procedure are extensively reviewed with the patient.  3.  The patient would like to proceed with the procedures.

## 2015-09-01 NOTE — Anesthesia Postprocedure Evaluation (Signed)
Anesthesia Post Note  Patient: Sanoe Fron  Procedure(s) Performed: Procedure(s) (LRB): LEFT ENDOSCOPIC TOTAL ETHMOIDECTOMY LEFT ENDOSCOPIC  MAXILLARY ANTROSTOMY LEFT ENDOSCOPIC FRONTAL RECESS EXPLORATION (Left) NASAL SEPTOPLASTY WITH TURBINATE REDUCTION (Bilateral)  Patient location during evaluation: PACU Anesthesia Type: General Level of consciousness: awake and alert Pain management: pain level controlled Vital Signs Assessment: post-procedure vital signs reviewed and stable Respiratory status: spontaneous breathing, nonlabored ventilation, respiratory function stable and patient connected to nasal cannula oxygen Cardiovascular status: blood pressure returned to baseline and stable Postop Assessment: no signs of nausea or vomiting Anesthetic complications: no    Last Vitals:  Vitals:   09/01/15 1245 09/01/15 1337  BP: 137/82 (!) 162/86  Pulse: 91 99  Resp: 15 18  Temp:  36.8 C    Last Pain:  Vitals:   09/01/15 1337  TempSrc: Oral  PainSc: 4                  , J

## 2015-09-01 NOTE — Discharge Instructions (Addendum)
°Post Anesthesia Home Care Instructions ° °Activity: °Get plenty of rest for the remainder of the day. A responsible adult should stay with you for 24 hours following the procedure.  °For the next 24 hours, DO NOT: °-Drive a car °-Operate machinery °-Drink alcoholic beverages °-Take any medication unless instructed by your physician °-Make any legal decisions or sign important papers. ° °Meals: °Start with liquid foods such as gelatin or soup. Progress to regular foods as tolerated. Avoid greasy, spicy, heavy foods. If nausea and/or vomiting occur, drink only clear liquids until the nausea and/or vomiting subsides. Call your physician if vomiting continues. ° °Special Instructions/Symptoms: °Your throat may feel dry or sore from the anesthesia or the breathing tube placed in your throat during surgery. If this causes discomfort, gargle with warm salt water. The discomfort should disappear within 24 hours. ° °If you had a scopolamine patch placed behind your ear for the management of post- operative nausea and/or vomiting: ° °1. The medication in the patch is effective for 72 hours, after which it should be removed.  Wrap patch in a tissue and discard in the trash. Wash hands thoroughly with soap and water. °2. You may remove the patch earlier than 72 hours if you experience unpleasant side effects which may include dry mouth, dizziness or visual disturbances. °3. Avoid touching the patch. Wash your hands with soap and water after contact with the patch. °  °-------------------- ° °POSTOPERATIVE INSTRUCTIONS FOR PATIENTS HAVING NASAL OR SINUS OPERATIONS °ACTIVITY: Restrict activity at home for the first two days, resting as much as possible. Light activity is best. You may usually return to work within a week. You should refrain from nose blowing, strenuous activity, or heavy lifting greater than 20lbs for a total of three weeks after your operation.  If sneezing cannot be avoided, sneeze with your mouth  open. °DISCOMFORT: You may experience a dull headache and pressure along with nasal congestion and discharge. These symptoms may be worse during the first week after the operation but may last as long as two to four weeks.  Please take Tylenol or the pain medication that has been prescribed for you. Do not take aspirin or aspirin containing medications since they may cause bleeding.  You may experience symptoms of post nasal drainage, nasal congestion, headaches and fatigue for two or three months after your operation.  °BLEEDING: You may have some blood tinged nasal drainage for approximately two weeks after the operation.  The discharge will be worse for the first week.  Please call our office at [phone] or go to the nearest hospital emergency room if you experience any of the following: heavy, bright red blood from your nose or mouth that lasts longer than ten minutes or coughing up or vomiting bright red blood or blood clots. °GENERAL CONSIDERATIONS: °1. A gauze dressing will be placed on your upper lip to absorb any drainage after the operation. You may need to change this several times a day.  If you do not have very much drainage, you may remove the dressing.  Remember that you may gently wipe your nose with a tissue and sniff in, but DO NOT blow your nose. °2. Please keep all of your postoperative appointments.  Your final results after the operation will depend on proper follow-up.  The initial visit is usually four to seven days after the operation.  During this visit, the remaining nasal packing and internal septal splints will be removed.  Your nasal and sinus cavities will be   cleaned.  During the second visit, your nasal and sinus cavities will be cleaned again. Have someone drive you to your first two postoperative appointments. We suggest that you take your prescribed pain medication about ½ hour prior to each of these two appointments.  °3. How you care for your nose after the operation will  influence the results that you obtain.  You should follow all directions, take your medication as prescribed, and call our office [phone] with any problems or questions. °4. You may be more comfortable sleeping with your head elevated on two pillows. °5. Do not take any medications that we have not prescribed or recommended. °WARNING SIGNS: if any of the following should occur, please call our office: °1. Bright red bleeding which lasts more than 10 minutes. °2. Persistent fever greater than 102F. °3. Persistent vomiting. °4. Severe and constant pain that is not relieved by prescribed pain medication. °5. Trauma to the nose. °6. Rash or unusual side effects from any medicines. ° °

## 2015-09-01 NOTE — Anesthesia Preprocedure Evaluation (Signed)
Anesthesia Evaluation  Patient identified by MRN, date of birth, ID band Patient awake and Patient confused    Reviewed: Allergy & Precautions, NPO status , Patient's Chart, lab work & pertinent test results  Airway Mallampati: II  TM Distance: >3 FB Neck ROM: Full    Dental no notable dental hx.    Pulmonary former smoker,    Pulmonary exam normal breath sounds clear to auscultation       Cardiovascular hypertension, Normal cardiovascular exam Rhythm:Regular Rate:Normal     Neuro/Psych negative neurological ROS  negative psych ROS   GI/Hepatic Neg liver ROS, GERD  ,  Endo/Other  negative endocrine ROS  Renal/GU negative Renal ROS  negative genitourinary   Musculoskeletal negative musculoskeletal ROS (+)   Abdominal (+) + obese,   Peds negative pediatric ROS (+)  Hematology negative hematology ROS (+)   Anesthesia Other Findings   Reproductive/Obstetrics negative OB ROS                             Anesthesia Physical Anesthesia Plan  ASA: II  Anesthesia Plan: General   Post-op Pain Management:    Induction: Intravenous  Airway Management Planned: Oral ETT  Additional Equipment:   Intra-op Plan:   Post-operative Plan: Extubation in OR  Informed Consent: I have reviewed the patients History and Physical, chart, labs and discussed the procedure including the risks, benefits and alternatives for the proposed anesthesia with the patient or authorized representative who has indicated his/her understanding and acceptance.   Dental advisory given  Plan Discussed with: CRNA  Anesthesia Plan Comments:         Anesthesia Quick Evaluation

## 2015-09-01 NOTE — Op Note (Signed)
DATE OF PROCEDURE: 09/01/2015  OPERATIVE REPORT   SURGEON: Leta Baptist, MD   PREOPERATIVE DIAGNOSES:  1. Severe nasal septal deviation.  2. Bilateral inferior turbinate hypertrophy.  3. Chronic nasal obstruction. 4. Chronic left maxillary, ethmoid, and frontal sinusitis.  POSTOPERATIVE DIAGNOSES:  1. Severe nasal septal deviation.  2. Bilateral inferior turbinate hypertrophy.  3. Chronic nasal obstruction. 4. Chronic left maxillary, ethmoid, and frontal fungal sinusitis.  PROCEDURE PERFORMED:  1. Septoplasty.  2. Bilateral partial inferior turbinate resection.  3. Endoscopic left frontal sinusotomy with polyp removal. 4. Endoscopic left total ethmoidectomy 5. Endoscopic left maxillary antrostomy with tissue removal    6. FUSION stereotactic image guidance  ANESTHESIA: General endotracheal tube anesthesia.   COMPLICATIONS: None.   ESTIMATED BLOOD LOSS: Less than 200 mL.   INDICATION FOR PROCEDURE: Teshara Heironimus is a 55 y.o. female with a history of chronic nasal obstruction and recurrent sinusitis. The patient was previously treated with multiple courses of antibiotics, antihistamine, decongestant, steroid nasal spray, and systemic steroids. However, the patient continues to be symptomatic. On examination, the patient was noted to have bilateral severe inferior turbinate hypertrophy and significant nasal septal deviation, causing significant nasal obstruction. Polypoid tissue was also noted to obstruct the left middle meatus. On her sinus CT scan, she was noted to have significant opacification of her left maxillary, ethmoid, and frontal sinuses. Based on the above findings, the decision was made for the patient to undergo the above-stated procedure. The risks, benefits, alternatives, and details of the procedure were discussed with the patient. Questions were invited and answered. Informed consent was obtained.   DESCRIPTION OF PROCEDURE: The patient was taken to the operating room and  placed supine on the operating table. General endotracheal tube anesthesia was administered by the anesthesiologist. The patient was positioned, and prepped and draped in the standard fashion for nasal surgery. The FUSION stereotactic image guidance marker was placed. The image guidance system was functional throughout the case. Pledgets soaked with Afrin were placed in both nasal cavities for decongestion. The pledgets were subsequently removed. The above mentioned severe septal deviation was again noted. 1% lidocaine with 1:100,000 epinephrine was injected onto the nasal septum bilaterally. A hemitransfixion incision was made on the left side. The mucosal flap was carefully elevated on the left side. A cartilaginous incision was made 1 cm superior to the caudal margin of the nasal septum. Mucosal flap was also elevated on the right side in the similar fashion. It should be noted that due to the severe septal deviation, the deviated portion of the cartilaginous and bony septum had to be removed in piecemeal fashion. Once the deviated portions were removed, a straight midline septum was achieved. The septum was then quilted with 4-0 plain gut sutures. The hemitransfixion incision was closed with interrupted 4-0 chromic sutures.   Attention was then focused on the left paranasal sinuses. The left middle turbinate was medialized. A large amount of polypoid tissue was noted to obstruct the left middle meatus. The polypoid tissue was removed with a microdebrider. The uncinate process was resected with a freer elevator. The maxillary antrum was entered. The entire maxillary sinus was noted to be filled with fungal tissue/ fungal ball. The entire left maxillary sinus content was evacuated. The anterior and posterior ethmoid cavities were then entered. Polypoid tissue was removed from the ethmoid cavities. Attention was then focused on the frontal recess. The frontal recess was enlarged using a combination of Tru-Cut  forceps, microdebrider, and Blakesley forceps. Polypoid tissue was removed.  All 3 sinuses were copiously irrigated with saline solution.  A Xerogel packing was placed.  Attention was then focused on the inferior turbinate. The inferior one half of both hypertrophied inferior turbinate was crossclamped with a Kelly clamp. The inferior one half of each inferior turbinate was then resected with a pair of cross cutting scissors. Hemostasis was achieved with a suction cautery device.  Doyle splints were applied to the nasal septum bilaterally.   The care of the patient was turned over to the anesthesiologist. The patient was awakened from anesthesia without difficulty. The patient was extubated and transferred to the recovery room in good condition.   OPERATIVE FINDINGS: Severe nasal septal deviation and bilateral inferior turbinate hypertrophy.  A large fungal ball was noted within the left maxillary sinus. Polypoid tissue was also removed from the left maxillary, ethmoid, and frontal sinuses.  SPECIMEN:Left sinus contents   FOLLOWUP CARE: The patient be discharged home once she is awake and alert. The patient will be placed on Percocet p.r.n. pain, and clindamycin for 5 days. The patient will follow up in my office in approximately 1 week for splint removal.    Raynelle Bring, MD

## 2015-09-01 NOTE — Anesthesia Procedure Notes (Signed)
Procedure Name: Intubation Date/Time: 09/01/2015 10:20 AM Performed by: Lyndee Leo Pre-anesthesia Checklist: Patient identified, Emergency Drugs available, Suction available and Patient being monitored Patient Re-evaluated:Patient Re-evaluated prior to inductionOxygen Delivery Method: Circle system utilized Preoxygenation: Pre-oxygenation with 100% oxygen Intubation Type: IV induction Ventilation: Mask ventilation without difficulty Laryngoscope Size: Mac and 3 Grade View: Grade II Tube type: Oral Rae Number of attempts: 1 Airway Equipment and Method: Stylet and Oral airway Placement Confirmation: ETT inserted through vocal cords under direct vision,  positive ETCO2 and breath sounds checked- equal and bilateral Tube secured with: Tape Dental Injury: Teeth and Oropharynx as per pre-operative assessment

## 2015-09-01 NOTE — Transfer of Care (Signed)
Immediate Anesthesia Transfer of Care Note  Patient: Deborah Walter  Procedure(s) Performed: Procedure(s): LEFT ENDOSCOPIC TOTAL ETHMOIDECTOMY LEFT ENDOSCOPIC  MAXILLARY ANTROSTOMY LEFT ENDOSCOPIC FRONTAL RECESS EXPLORATION (Left) NASAL SEPTOPLASTY WITH TURBINATE REDUCTION (Bilateral)  Patient Location: PACU  Anesthesia Type:General  Level of Consciousness: awake, sedated and patient cooperative  Airway & Oxygen Therapy: Patient Spontanous Breathing and aerosol face mask  Post-op Assessment: Report given to RN and Post -op Vital signs reviewed and stable  Post vital signs: Reviewed and stable  Last Vitals:  Vitals:   09/01/15 0845  BP: 123/66  Pulse: 74  Resp: 20  Temp: 36.9 C    Last Pain:  Vitals:   09/01/15 0845  TempSrc: Oral         Complications: No apparent anesthesia complications

## 2015-09-02 ENCOUNTER — Ambulatory Visit: Payer: BLUE CROSS/BLUE SHIELD | Admitting: Allergy & Immunology

## 2015-09-03 ENCOUNTER — Encounter (HOSPITAL_BASED_OUTPATIENT_CLINIC_OR_DEPARTMENT_OTHER): Payer: Self-pay | Admitting: Otolaryngology

## 2015-09-04 ENCOUNTER — Ambulatory Visit (INDEPENDENT_AMBULATORY_CARE_PROVIDER_SITE_OTHER): Payer: BLUE CROSS/BLUE SHIELD | Admitting: Otolaryngology

## 2015-09-04 DIAGNOSIS — J320 Chronic maxillary sinusitis: Secondary | ICD-10-CM | POA: Diagnosis not present

## 2015-09-04 DIAGNOSIS — J322 Chronic ethmoidal sinusitis: Secondary | ICD-10-CM

## 2015-09-04 DIAGNOSIS — J321 Chronic frontal sinusitis: Secondary | ICD-10-CM | POA: Diagnosis not present

## 2015-09-18 ENCOUNTER — Ambulatory Visit (INDEPENDENT_AMBULATORY_CARE_PROVIDER_SITE_OTHER): Payer: BLUE CROSS/BLUE SHIELD | Admitting: Otolaryngology

## 2015-09-18 DIAGNOSIS — J321 Chronic frontal sinusitis: Secondary | ICD-10-CM | POA: Diagnosis not present

## 2015-09-18 DIAGNOSIS — J320 Chronic maxillary sinusitis: Secondary | ICD-10-CM

## 2015-09-18 DIAGNOSIS — J322 Chronic ethmoidal sinusitis: Secondary | ICD-10-CM

## 2015-10-16 ENCOUNTER — Other Ambulatory Visit: Payer: Self-pay

## 2015-10-16 MED ORDER — HYDROXYZINE HCL 10 MG PO TABS: 10.0000 mg | ORAL_TABLET | Freq: Every day | ORAL | 0 refills | Status: DC

## 2015-10-17 ENCOUNTER — Other Ambulatory Visit: Payer: Self-pay

## 2015-11-17 ENCOUNTER — Ambulatory Visit (INDEPENDENT_AMBULATORY_CARE_PROVIDER_SITE_OTHER): Payer: BLUE CROSS/BLUE SHIELD | Admitting: Allergy & Immunology

## 2015-11-17 ENCOUNTER — Encounter (INDEPENDENT_AMBULATORY_CARE_PROVIDER_SITE_OTHER): Payer: Self-pay

## 2015-11-17 ENCOUNTER — Encounter: Payer: Self-pay | Admitting: Allergy & Immunology

## 2015-11-17 VITALS — BP 100/60 | HR 69 | Temp 98.0°F | Resp 16

## 2015-11-17 DIAGNOSIS — J3089 Other allergic rhinitis: Secondary | ICD-10-CM

## 2015-11-17 DIAGNOSIS — T781XXD Other adverse food reactions, not elsewhere classified, subsequent encounter: Secondary | ICD-10-CM | POA: Diagnosis not present

## 2015-11-17 DIAGNOSIS — L299 Pruritus, unspecified: Secondary | ICD-10-CM | POA: Diagnosis not present

## 2015-11-17 MED ORDER — HYDROXYZINE HCL 10 MG PO TABS: 10.0000 mg | ORAL_TABLET | Freq: Every day | ORAL | 2 refills | Status: DC

## 2015-11-17 MED ORDER — FLUTICASONE PROPIONATE 50 MCG/ACT NA SUSP: 1.0000 | Freq: Every day | NASAL | 12 refills | Status: DC

## 2015-11-17 NOTE — Progress Notes (Signed)
[facility]  Date of Service/Encounter:  11/18/15   Assessment:   Chronic nonseasonal allergic rhinitis due to other allergen - Plan: Allergy Test, fluticasone (FLONASE) 50 MCG/ACT nasal spray  Itching - Plan: Allergy Test    Plan/Recommendations:    1. Chronic rhinitis - Testing showed: [place] grass, ragweed, and Helminthosporium - Deferred intradermal testing per patient preference.  - Food testing was negative therefore there is no indication to change her diet.  - Start Flonase two sprays per nostril daily. - Continue hydroxyzine one 10mg  tablet at night. 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  2. Itching - Restart the hydroxyzine tablet (10mg  nightly).  - Refills sent in.  3. Return in about 6 months (around 05/17/2016).   Subjective:   Deborah Walter is a 55 y.o. female presenting today for follow up of  Chief Complaint  Patient presents with  . Allergy Testing  .  Deborah Walter has a history of the following: There are no active problems to display for this patient.   History obtained from: chart review and patient.  Deborah Walter was referred by Deloria Lair, MD.     Deborah Walter is a 55 y.o. female presenting for a follow up visit. Deborah Walter was last seen in February 2017 by Dr. Ishmael Holter, who has since left this practice. At the time, she had antihistamines in testing was performed but not interpretable. She was started on cetirizine 10 mg daily as well as Flonase 1 spray per nostril daily. There were concerns about a peanut or almond allergy as well. She had initially seen Dr. Ishmael Holter due urticaria triggered by amoxicillin, which persisted for a few months (July 2016 through September 2016). She did endorse improvement with Advil from the itching.   Since the last visit, she has been continuing to itch. Itch is fairly well controlled with one hydroxyzine tablet (10mg ) at night. She is  not needing any breakthrough cetirizine treatments. She was breaking out with hives late last year but has not had itching since then. She has missed another appointment due to her taking the antihistamine. She has been off of the antihistamine for four days. She does have sneezing but also itching. She does sometimes have nasal symptoms. She did undergo an ENT sinus surgery in August 2017 by Dr. Benjamine Mola. She had her deviated septum fixed. She had seven sinus infections last year but no antibiotics since then. She is happy with how well her nasal symptoms have improved following the surgery.  She had migraines when she ate almonds at some point. She also had headaches with peanuts, but this is dose dependent. Otherwise she believes that she has tolerated all of the other highly allergenic foods without a problem.   Otherwise, there have been no changes to her past medical history, surgical history, family history, or social history.    Review of Systems: a 14-point review of systems is pertinent for what is mentioned in HPI.  Otherwise, all other systems were negative. Constitutional: negative other than that listed in the HPI Eyes: negative other than that listed in the HPI Ears, nose, mouth, throat, and face: negative other than that listed in the HPI Respiratory: negative other than that listed in the HPI Cardiovascular: negative other than that listed in the HPI Gastrointestinal: negative other than that listed in the HPI Genitourinary: negative other than that listed in the HPI  Integument: negative other than that listed in the HPI Hematologic: negative other than that listed in the HPI Musculoskeletal: negative other than that listed in the HPI Neurological: negative other than that listed in the HPI Allergy/Immunologic: negative other than that listed in the HPI    Objective:   Blood pressure 100/60, pulse 69, temperature 98 F (36.7 C), temperature source Oral, resp. rate 16. There  is no height or weight on file to calculate BMI.   Physical Exam:  General: Alert, interactive, in no acute distress. Cooperative with the exam. HEENT: TMs pearly gray, turbinates edematous and pale with clear discharge, post-pharynx mildly erythematous. Neck: Supple without thyromegaly. Lungs: Clear to auscultation without wheezing, rhonchi or rales. No increased work of breathing. CV: Normal S1/S2, no murmurs. Capillary refill <2 seconds.  Abdomen: Nondistended, nontender. No guarding or rebound tenderness. Bowel sounds hyperactive  Skin: Warm and dry, without lesions or rashes. Extremities:  No clubbing, cyanosis or edema. Neuro:   Grossly intact.  Diagnostic studies:   Allergy Studies:   Indoor/Outdoor Percutaneous Adult Environmental Panel: Positive to [place] grass, ragweed, and Helminthosporium with adequate control  Most Common Food Allergen Panel: negative to all (peanuts, tree nuts, soy, egg, cow's milk, shellfish mix, fish mix) with adequate controls   Salvatore Marvel, MD [facility] [place]

## 2015-11-17 NOTE — Patient Instructions (Addendum)
1. Chronic rhinitis - Testing showed: [place] grass, ragweed, and Helminthosporium - Start Flonase two sprays per nostril daily. - Continue hydroxyzine one 10mg  tablet at night.  2. Itching - Restart the hydroxyzine tablet (10mg  nightly).  - Refills sent in.  3. Return in about 6 months (around 05/17/2016).  Please inform us of any Emergency Department visits, hospitalizations, or changes in symptoms. Call us before going to the ED for breathing or allergy symptoms since we might be able to fit you in for a sick visit. Feel free to contact us anytime with any questions, problems, or concerns.  It was a pleasure to meet you today!   Websites that have reliable patient information: 1. American Academy of Asthma, Allergy, and Immunology: www.aaaai.org 2. Food Allergy Research and Education (FARE): foodallergy.org 3. Mothers of Asthmatics: http://www.asthmacommunitynetwork.org 4. American College of Allergy, Asthma, and Immunology: www.acaai.org

## 2015-11-18 ENCOUNTER — Telehealth: Payer: Self-pay | Admitting: Allergy & Immunology

## 2015-11-18 ENCOUNTER — Other Ambulatory Visit: Payer: Self-pay | Admitting: *Deleted

## 2015-11-18 MED ORDER — HYDROXYZINE HCL 10 MG PO TABS: 10.0000 mg | ORAL_TABLET | Freq: Every day | ORAL | 2 refills | Status: DC

## 2015-11-18 NOTE — Telephone Encounter (Signed)
She said that the Hydroxyzine was supposed to have been sent to the pharmacy yesterday but the pharmacy hasn't received the order for it yet. Can that please be sent in?   She uses [facility] in [place].

## 2015-11-18 NOTE — Telephone Encounter (Signed)
Script sent  

## 2015-12-22 NOTE — Telephone Encounter (Signed)
complete

## 2016-12-06 ENCOUNTER — Other Ambulatory Visit: Payer: Self-pay | Admitting: Allergy & Immunology

## 2017-02-04 ENCOUNTER — Other Ambulatory Visit: Payer: Self-pay | Admitting: Allergy and Immunology

## 2017-07-10 IMAGING — CT CT MAXILLOFACIAL W/O CM
3 of 4 series · 13 of 47 positions shown, 15 images · non-contrast
Comparison: [facility]] paranasal sinus CT
03/19/2015 and earlier.

CLINICAL DATA: 54-year-old female with maxillary and frontal sinus
pain and pressure with drainage since July 2014. Subsequent
encounter.

EXAM:
CT MAXILLOFACIAL WITHOUT CONTRAST
TECHNIQUE: Multidetector CT imaging of the maxillofacial structures was
performed. Multiplanar CT image reconstructions were also generated.
A small metallic BB was placed on the right temple in order to
reliably differentiate right from left.

[Series 3: stealth sinus sinus alg 1.0 burn cd · axial · 0.30mm/px · z∈[+95,+183]mm · 7 of 104 slices shown, 9 images]
[im 8/104  brain]
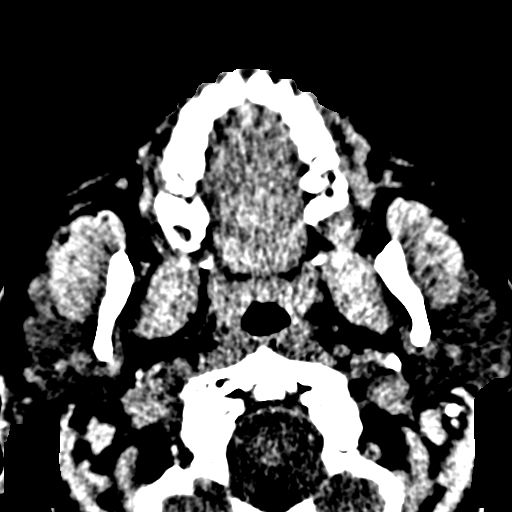
[im 8/104  bone]
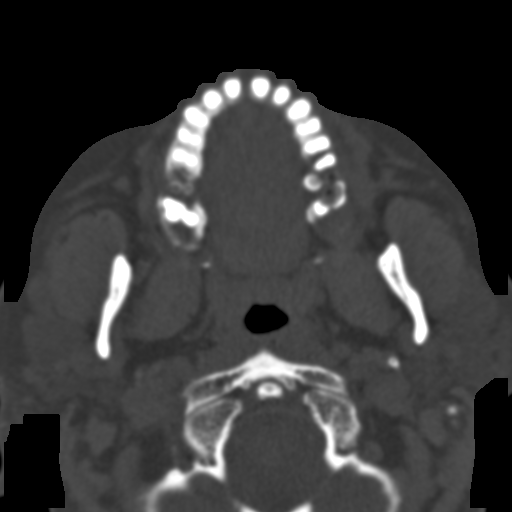
[im 23/104  bone]
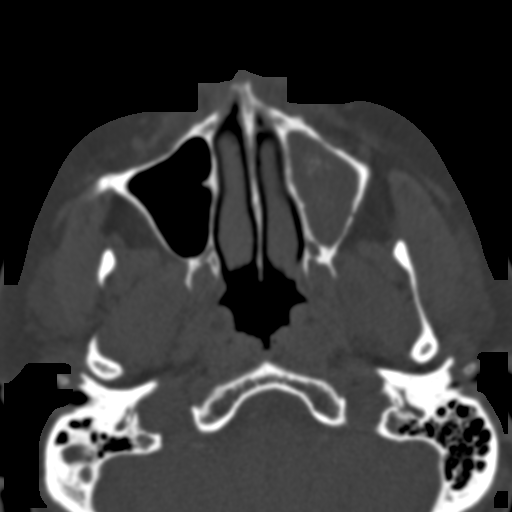
[im 37/104  bone]
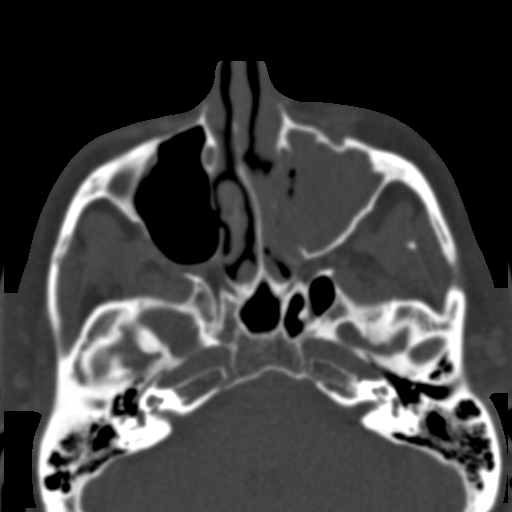
[im 52/104  bone]
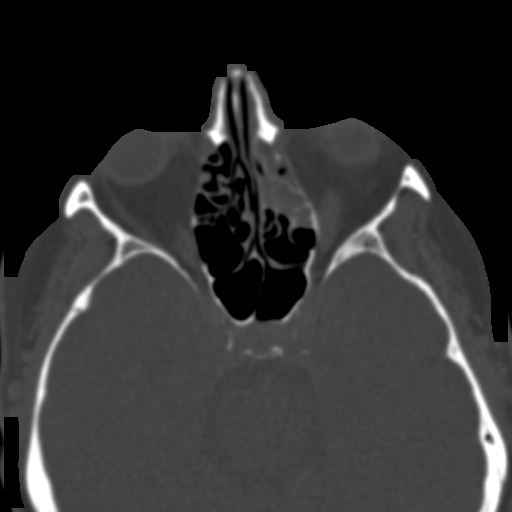
[im 67/104  brain]
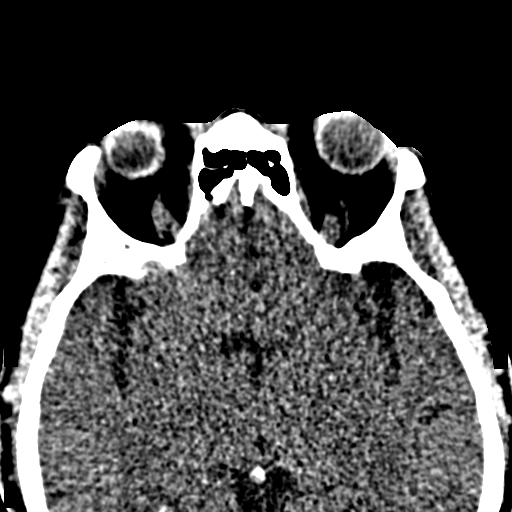
[im 67/104  bone]
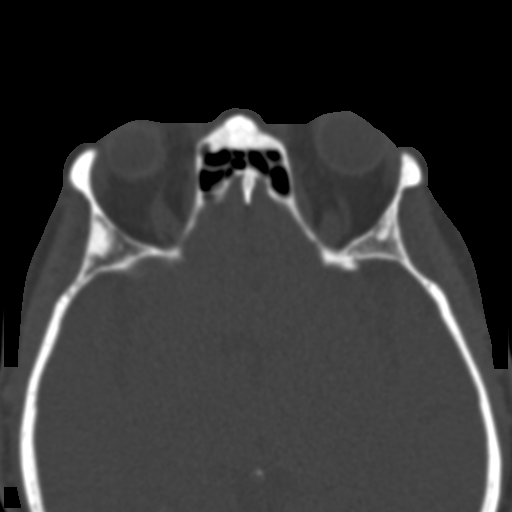
[im 81/104  bone]
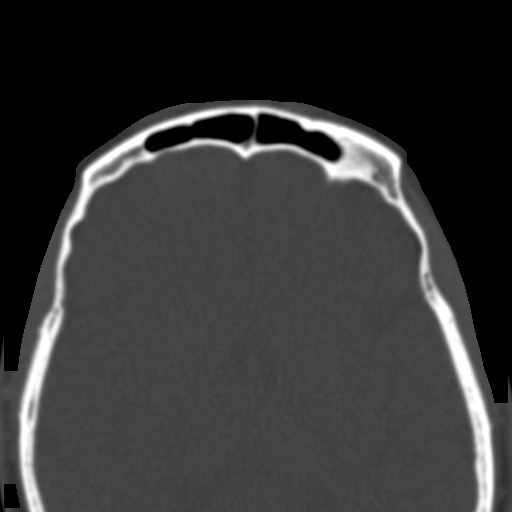
[im 96/104  bone]
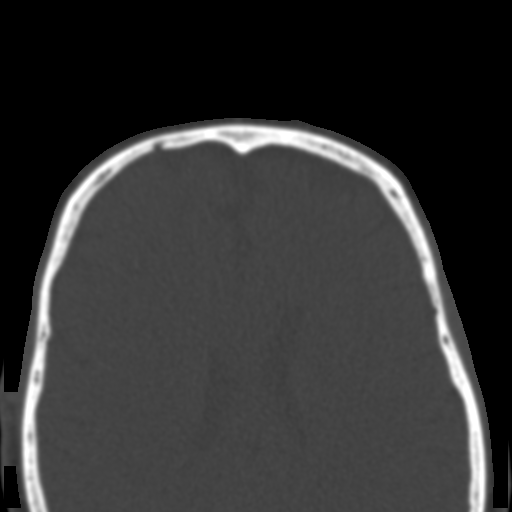

[Series 4: stealth sinus coro 2.0 · coronal · 0.23mm/px · 3 of 83 slices shown]
[im 28/83  bone]
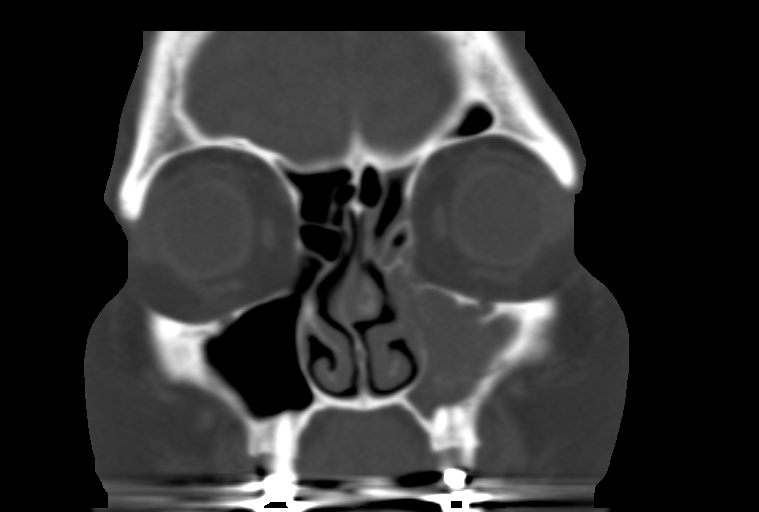
[im 37/83  bone]
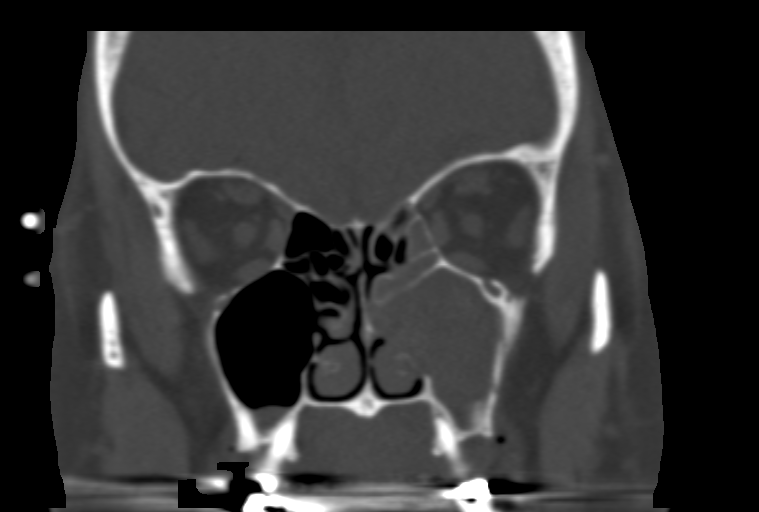
[im 46/83  bone]
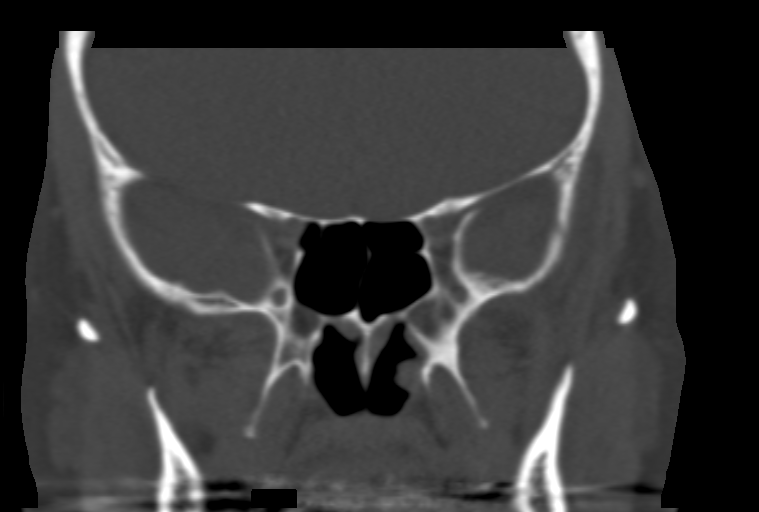

[Series 5: stealth sinus sag 2.0 · sagittal · 0.21mm/px · 3 of 86 slices shown]
[im 29/86  bone]
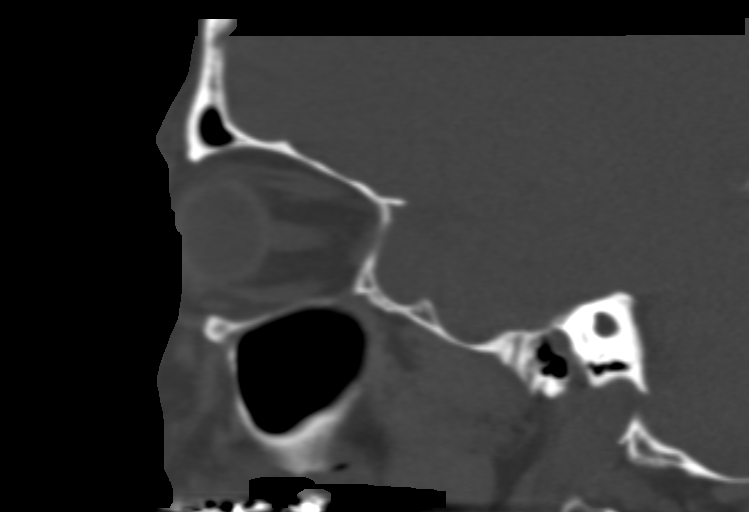
[im 43/86  bone]
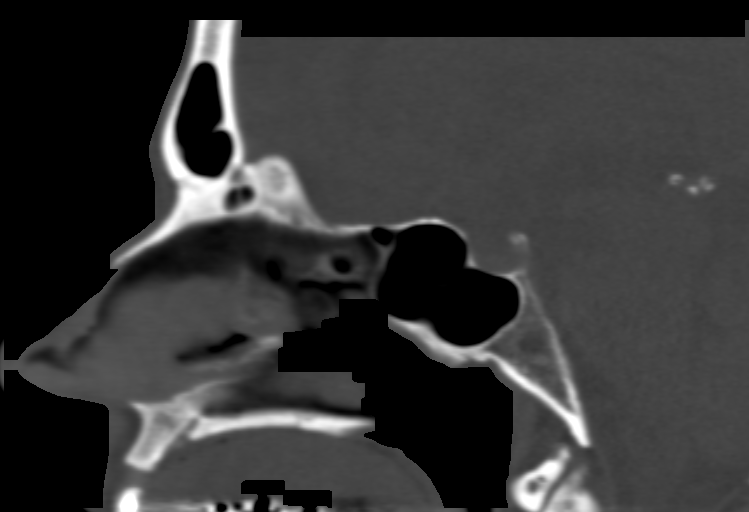
[im 57/86  bone]
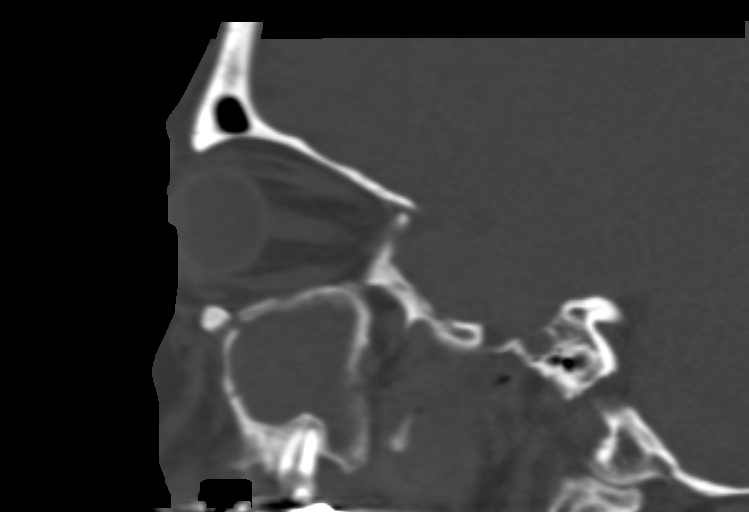

[13 of 47 positions shown; findings below may reference images not displayed]

FINDINGS: Negative visualized noncontrast brain parenchyma. Calcified
atherosclerosis at the skull base. Visualized orbit soft tissues are
within normal limits. Visualized scalp soft tissues are within
normal limits. Negative visualized noncontrast deep soft tissue
spaces of the face.

Bilateral tympanic cavities are clear. The left mastoids are clear.
The right mastoids are clear aside from an opacified inferior air
cell, and this is unchanged since 05/16/2007.

Both sphenoid sinuses are clear. The left is hyperplastic (coronal
image 50).

The right ethmoid air cells are clear aside from mild anterior air
cell mucosal thickening which is chronic.

Both frontal sinuses are clear aside from minimal to Mild
frontoethmoidal recess mucosal thickening.

There is mild mucosal thickening in the alveolar recess of the right
maxillary sinus which otherwise is clear. The right OMC appears
patent on coronal image 33.

Chronic left maxillary sinus disease. Near complete opacification of
the sinus which appears mildly expanded with mild rightward mass
effect on the OMC structures. A portion of the material within the
inferior aspect of the sinus demonstrates speckled calcifications
(coronal image 30). There is generalized maxillary periosteal
thickening.

Subtotal opacification of the adjacent left anterior ethmoid air
cells. The posterior left ethmoids remain clear. No bony destruction
identified. The left OMC is opacified (coronal image 33)

Rightward nasal septal deviation. Symmetric appearing nasal cavity
mucosal thickening. Poor dentition of the left maxillary molars.
IMPRESSION: 1. Chronic left maxillary sinusitis with slow progression over this
series of exams. Subtotal opacification and mildly expanded
appearance of the sinus which has some partially calcified internal
contents. Chronic left maxillary periostitis.
2. Superimposed poor dentition in the posterior left maxillary
molars.
3. Subtotal opacification of the anterior left ethmoid air cells.
Otherwise minor bilateral frontoethmoidal and right maxillary sinus
mucosal thickening.
4. Top differential considerations include chronic inflammatory or
polyp related obstruction of the left maxillary sinus versus chronic
odontogenic inflammatory sinusitis.
5. Rightward nasal septal deviation. Symmetric nasal cavity mucosal
thickening raising the possibility of rhinitis.

## 2017-10-10 ENCOUNTER — Ambulatory Visit: Payer: BLUE CROSS/BLUE SHIELD | Admitting: Obstetrics and Gynecology

## 2017-10-10 ENCOUNTER — Encounter: Payer: Self-pay | Admitting: Obstetrics and Gynecology

## 2017-10-10 VITALS — BP 145/86 | HR 74 | Ht 70.0 in | Wt 228.0 lb

## 2017-10-10 DIAGNOSIS — R921 Mammographic calcification found on diagnostic imaging of breast: Secondary | ICD-10-CM

## 2017-10-10 NOTE — Progress Notes (Signed)
[facility] Visit  @DATE @            Patient name: Margarete Horace MRN [id number]  Date of birth: 1960/02/18  CC & HPI:  Joey Hudock is a 57 y.o. female presenting today for right breast calcification. Pt brings photos on her phone. I have received a cd from [facility] and will take it to the breast cancer specialist when they are here at [facility] this week Patient comfortable with this plan ROS:  ROS   Pertinent History Reviewed:   Reviewed: Significant for history of clogged breast duct many years ago with negative work-up.  No breast biopsies Medical         Past Medical History:  Diagnosis Date  . Acid reflux   . Elevated cholesterol   . Hypertension                               Surgical Hx:    Past Surgical History:  Procedure Laterality Date  . CLAVICLE SURGERY Right Early 1990  . CYSTECTOMY  Early 1990   Bottom of spine  . NASAL SEPTOPLASTY W/ TURBINOPLASTY Bilateral 09/01/2015   Procedure: NASAL SEPTOPLASTY WITH TURBINATE REDUCTION;  Surgeon: Leta Baptist, MD;  Location: [facility];  Service: ENT;  Laterality: Bilateral;  . SINUS ENDO WITH FUSION Left 09/01/2015   Procedure: LEFT ENDOSCOPIC TOTAL ETHMOIDECTOMY LEFT ENDOSCOPIC  MAXILLARY ANTROSTOMY LEFT ENDOSCOPIC FRONTAL RECESS EXPLORATION;  Surgeon: Leta Baptist, MD;  Location: [facility];  Service: ENT;  Laterality: Left;   Medications: Reviewed & Updated - see associated section                       Current Outpatient Medications:  .  albuterol (PROVENTIL HFA;VENTOLIN HFA) 108 (90 Base) MCG/ACT inhaler, Inhale into the lungs every 6 (six) hours as needed for wheezing or shortness of breath., Disp: , Rfl:  .  Calcium Carb-Cholecalciferol (CALCIUM 1000 + D PO), Take by mouth daily., Disp: , Rfl:  .  hydrOXYzine (ATARAX/VISTARIL) 10 MG tablet, Take 1 tablet (10 mg total) by mouth daily., Disp: 90 tablet, Rfl: 2 .  lisinopril-hydrochlorothiazide (PRINZIDE,ZESTORETIC) 20-25 MG  tablet, Take 1 tablet by mouth daily., Disp: , Rfl:  .  magnesium oxide (MAG-OX) 400 MG tablet, Take 400 mg by mouth daily. , Disp: , Rfl:  .  omeprazole (PRILOSEC) 20 MG capsule, Take 20 mg by mouth as needed. , Disp: , Rfl: 0 .  oxyCODONE-acetaminophen (PERCOCET/ROXICET) 5-325 MG tablet, Take 1 tablet by mouth every 4 (four) hours as needed for severe pain., Disp: 20 tablet, Rfl: 0 .  ranitidine (ZANTAC) 150 MG tablet, Take 150 mg by mouth daily. , Disp: , Rfl:    Social History: Reviewed -  reports that she quit smoking about 3 years ago. Her smoking use included cigarettes. She has never used smokeless tobacco.  Objective Findings:  Vitals: Blood pressure (!) 145/86, pulse 74, height 5\' 10"  (1.778 m), weight 228 lb (103.4 kg).  PHYSICAL EXAMINATION General appearance - alert, well appearing, and in no distress, oriented to person, place, and time and overweight Mental status - alert, oriented to person, place, and time, normal mood, behavior, speech, dress, motor activity, and thought processes Chest -  Heart - normal rate and regular rhythm Abdomen - soft, nontender, nondistended, no masses or organomegaly Breasts - breasts appear normal, no suspicious masses, no skin  or nipple changes or axillary nodes, the area on the right breast that correlates with the calcific area feels entirely normal axilla is negative for lymphadenopathy Skin - normal coloration and turgor, no rashes, no suspicious skin lesions noted Abdomen nontender   Assessment & Plan:   A:  1. Breast calcification right lateral breast, not nonpalpable  P:  1. Will review CD pictures with breast team at [facility] this week 2. Follow-up by phone

## 2017-10-31 ENCOUNTER — Encounter: Payer: Self-pay | Admitting: Gastroenterology

## 2017-11-09 ENCOUNTER — Telehealth: Payer: Self-pay | Admitting: Allergy

## 2017-11-09 ENCOUNTER — Ambulatory Visit: Payer: BLUE CROSS/BLUE SHIELD | Admitting: Allergy

## 2017-11-09 MED ORDER — HYDROXYZINE HCL 10 MG PO TABS: 10.0000 mg | ORAL_TABLET | Freq: Every day | ORAL | 0 refills | Status: DC

## 2017-11-09 NOTE — Telephone Encounter (Signed)
Patient needs a refill on hydroxyzine 10mg . called into the [facility] Patient has appointment in november needs extension on mediation until seen

## 2017-11-09 NOTE — Telephone Encounter (Signed)
30 day supply has been sent in for her until seen in office.

## 2017-11-09 NOTE — Addendum Note (Signed)
Addended by: Lucrezia Starch I on: 11/09/2017 03:01 PM   Modules accepted: Orders

## 2017-12-07 ENCOUNTER — Ambulatory Visit (INDEPENDENT_AMBULATORY_CARE_PROVIDER_SITE_OTHER): Payer: BLUE CROSS/BLUE SHIELD | Admitting: Allergy

## 2017-12-07 ENCOUNTER — Encounter: Payer: Self-pay | Admitting: Allergy

## 2017-12-07 VITALS — BP 146/94 | HR 90 | Temp 97.7°F | Resp 16 | Ht 68.5 in | Wt 221.4 lb

## 2017-12-07 DIAGNOSIS — R05 Cough: Secondary | ICD-10-CM

## 2017-12-07 DIAGNOSIS — L299 Pruritus, unspecified: Secondary | ICD-10-CM | POA: Diagnosis not present

## 2017-12-07 DIAGNOSIS — J3089 Other allergic rhinitis: Secondary | ICD-10-CM

## 2017-12-07 DIAGNOSIS — R059 Cough, unspecified: Secondary | ICD-10-CM

## 2017-12-07 MED ORDER — MONTELUKAST SODIUM 10 MG PO TABS: 10.0000 mg | ORAL_TABLET | Freq: Every day | ORAL | 5 refills | Status: DC

## 2017-12-07 MED ORDER — HYDROXYZINE HCL 10 MG PO TABS: 10.0000 mg | ORAL_TABLET | Freq: Every evening | ORAL | 5 refills | Status: DC | PRN

## 2017-12-07 MED ORDER — TRIAMCINOLONE ACETONIDE 55 MCG/ACT NA AERO: 2.0000 | INHALATION_SPRAY | Freq: Every day | NASAL | 5 refills | Status: DC

## 2017-12-07 NOTE — Progress Notes (Signed)
Follow Up Note  RE: Deborah Walter MRN: [id number] DOB: 1960/02/16 Date of Office Visit: 12/07/2017  Referring provider: Deloria Lair., MD Primary care provider: Sandi Mealy, MD  Chief Complaint: Allergic Rhinitis   History of Present Illness: I had the pleasure of seeing Deborah Walter for a follow up visit at the [facility] on 12/07/2017. She is a 57 y.o. female, who is being followed for allergic rhinitis and adverse food reaction. Today she is here for regular follow up visit and for intradermal testing. She is accompanied today by her sister who provided/contributed to the history. Her previous allergy office visit was on 11/17/2015 with Dr. Ernst Bowler.   1. Chronic rhinitis Patient used Flonase for about 2 months with no change in symptoms and lately having issues with sneezing, PND, coughing and headache. Stopped antihistamines due to worsening restless leg syndrome. PCP prescribed lyrica today. Never tried Singulair.  Patient had sinus surgery a few years ago with some benefit.   2. Itching Hydroxyzine helps the itching as noticed worsening symptoms when stopped. No associated rashes with this.  Patient's bloodwork in April 2019 - normal CBC diff and unremarkable CMP.  Assessment and Plan: Enya is a 57 y.o. female with: Other allergic rhinitis Past history - 2017 skin testing was positive to ragweed, [place] grass and Helminthosporium. Interim history - Flonase worsened symptoms, antihistamines worsened RLS and now having increased rhinitis symptoms.  Today's intradermal testing was positive to additional molds and dust mites. Discussed environmental control measures.  Start Nasacort 2 sprays daily and demonstrated proper use.  Start singulair 10mg  daily.   Monitor symptoms. If above regimen does not control symptoms will discuss allergy immunotherapy at next visit.   Pruritus Pruritus with no rash. CBC diff and CMP normal in April  2019.  Better with hydroxyzine.   May take hydroxyzine 10mg  at night 1 hour before bedtime as needed for itching.   Discussed proper skin care measures.   Coughing Coughing the last 2 months.  Normal spirometry today. Monitor symptoms.  Return in about 2 months (around 02/06/2018).  Meds ordered this encounter  Medications  . montelukast (SINGULAIR) 10 MG tablet    Sig: Take 1 tablet (10 mg total) by mouth at bedtime.    Dispense:  30 tablet    Refill:  5  . triamcinolone (NASACORT) 55 MCG/ACT AERO nasal inhaler    Sig: Place 2 sprays into the nose daily.    Dispense:  1 Inhaler    Refill:  5  . hydrOXYzine (ATARAX/VISTARIL) 10 MG tablet    Sig: Take 1 tablet (10 mg total) by mouth at bedtime as needed for itching.    Dispense:  30 tablet    Refill:  5   Diagnostics: Spirometry:  Tracings reviewed. Her effort: Good reproducible efforts. FVC: 3.36L FEV1: 2.64L, 88% predicted FEV1/FVC ratio: 79% Interpretation: Spirometry consistent with normal pattern.  Please see scanned spirometry results for details.  Skin Testing: Environmental allergy panel. Positive test to: mold and dust mites. Results discussed with patient/family. Intradermal - 12/07/17 1502    Time Antigen Placed  1500    Allergen Manufacturer  Greer    Location  Arm    Number of Test  15    Intradermal  Select    Control  Negative    [place]  Negative    Johnson  Negative    7 Grass  Negative    Ragweed mix  Negative    Sherrie Mustache  mix  Negative    Tree mix  Negative    Mold 1  Negative    Mold 2  Negative    Mold 3  2+    Mold 4  2+    Cat  Negative    Dog  Negative    Cockroach  Negative    Mite mix  2+       Medication List:  Current Outpatient Medications  Medication Sig Dispense Refill  . albuterol (PROVENTIL HFA;VENTOLIN HFA) 108 (90 Base) MCG/ACT inhaler Inhale into the lungs every 6 (six) hours as needed for wheezing or shortness of breath.    . Calcium Carb-Cholecalciferol (CALCIUM 1000  + D PO) Take by mouth daily.    . fluticasone (FLONASE) 50 MCG/ACT nasal spray Place into both nostrils daily.    . hydrOXYzine (ATARAX/VISTARIL) 10 MG tablet Take 1 tablet (10 mg total) by mouth at bedtime as needed for itching. 30 tablet 5  . lisinopril-hydrochlorothiazide (PRINZIDE,ZESTORETIC) 20-25 MG tablet Take 1 tablet by mouth daily.    . magnesium oxide (MAG-OX) 400 MG tablet Take 400 mg by mouth daily.     Marland Kitchen omeprazole (PRILOSEC) 20 MG capsule Take 20 mg by mouth as needed.   0  . oxyCODONE-acetaminophen (PERCOCET/ROXICET) 5-325 MG tablet Take 1 tablet by mouth every 4 (four) hours as needed for severe pain. 20 tablet 0  . ranitidine (ZANTAC) 150 MG tablet Take 150 mg by mouth daily.     . montelukast (SINGULAIR) 10 MG tablet Take 1 tablet (10 mg total) by mouth at bedtime. 30 tablet 5  . triamcinolone (NASACORT) 55 MCG/ACT AERO nasal inhaler Place 2 sprays into the nose daily. 1 Inhaler 5   No current facility-administered medications for this visit.    Allergies: Allergies  Allergen Reactions  . Amoxicillin Swelling    Facial swelling and severe itching  . Atorvastatin Other (See Comments)  . Diclofenac     Caused dry mouth  . Gramineae Pollens     unknown  . Other     ALMONDS.  CAUSE MIGRAINE HEADACHE unknown  . Pravastatin Other (See Comments)  . Cefuroxime Rash  . Chocolate Itching   I reviewed her past medical history, social history, family history, and environmental history and no significant changes have been reported from previous visit on 11/17/2015.  Review of Systems  Constitutional: Negative for appetite change, chills, fever and unexpected weight change.  HENT: Positive for congestion, rhinorrhea, sinus pressure and sneezing.   Eyes: Negative for itching.  Respiratory: Positive for cough. Negative for chest tightness, shortness of breath and wheezing.   Gastrointestinal: Negative for abdominal pain.  Skin: Negative for rash.  Allergic/Immunologic:  Positive for environmental allergies.  Neurological: Positive for headaches.   Objective: BP (!) 146/94 (BP Location: Left Arm, Patient Position: Sitting, Cuff Size: Normal)   Pulse 90   Temp 97.7 F (36.5 C) (Oral)   Resp 16   Ht 5' 8.5" (1.74 m)   Wt 221 lb 6.4 oz (100.4 kg)   SpO2 98%   BMI 33.17 kg/m  Body mass index is 33.17 kg/m. Physical Exam  Constitutional: She is oriented to person, place, and time. She appears well-developed and well-nourished.  HENT:  Head: Normocephalic and atraumatic.  Right Ear: External ear normal.  Left Ear: External ear normal.  Nose: Nose normal.  Mouth/Throat: Oropharynx is clear and moist.  Eyes: Conjunctivae and EOM are normal.  Neck: Neck supple.  Cardiovascular: Normal rate, regular rhythm and normal  heart sounds. Exam reveals no gallop and no friction rub.  No murmur heard. Pulmonary/Chest: Effort normal and breath sounds normal. She has no wheezes. She has no rales.  Lymphadenopathy:    She has no cervical adenopathy.  Neurological: She is alert and oriented to person, place, and time.  Skin: Skin is warm. No rash noted.  Psychiatric: She has a normal mood and affect. Her behavior is normal.  Nursing note and vitals reviewed.  Previous notes and tests were reviewed. The plan was reviewed with the patient/family, and all questions/concerned were addressed.  It was my pleasure to see Lewanda today and participate in her care. Please feel free to contact me with any questions or concerns.  Sincerely,  Rexene Alberts, DO Allergy & Immunology  [facility] office: [phone] [place]

## 2017-12-07 NOTE — Assessment & Plan Note (Addendum)
Pruritus with no rash. CBC diff and CMP normal in April 2019.  Better with hydroxyzine.   May take hydroxyzine 10mg  at night 1 hour before bedtime as needed for itching.   Discussed proper skin care measures.

## 2017-12-07 NOTE — Assessment & Plan Note (Signed)
Coughing the last 2 months.  Normal spirometry today. Monitor symptoms.

## 2017-12-07 NOTE — Patient Instructions (Addendum)
Other allergic rhinitis Past history - 2017 skin testing was positive to ragweed, [place] grass and Helminthosporium. Interim history - Flonase worsened symptoms, antihistamines worsened RLS and now having increased rhinitis symptoms.  Today's intradermal testing was positive to additional molds and dust mites. Discussed environmental control measures.  Start Nasacort 2 sprays daily and demonstrated proper use.  Start singulair 10mg  daily.   Monitor symptoms. If above regimen does not control symptoms will discuss allergy immunotherapy at next visit.   Pruritus Pruritus with no rash. CBC diff and CMP normal in April 2019.  Better with hydroxyzine.   May take hydroxyzine 10mg  at night 1 hour before bedtime as needed for itching.   Discussed proper skin care measures.   Coughing Coughing the last 2 months.  Normal spirometry today. Monitor symptoms.  Return in about 2 months (around 02/06/2018).  Reducing Pollen Exposure . Pollen seasons: trees (spring), grass (summer) and ragweed/weeds (fall). Marland Kitchen Keep windows closed in your home and car to lower pollen exposure.  Susa Simmonds air conditioning in the bedroom and throughout the house if possible.  . Avoid going out in dry windy days - especially early morning. . Pollen counts are highest between 5 - 10 AM and on dry, hot and windy days.  . Save outside activities for late afternoon or after a heavy rain, when pollen levels are lower.  . Avoid mowing of grass if you have grass pollen allergy. Marland Kitchen Be aware that pollen can also be transported indoors on people and pets.  . Dry your clothes in an automatic dryer rather than hanging them outside where they might collect pollen.  . Rinse hair and eyes before bedtime. Control of House Dust Mite Allergen . Dust mite allergens are a common trigger of allergy and asthma symptoms. While they can be found throughout the house, these microscopic creatures thrive in warm, humid environments such as  bedding, upholstered furniture and carpeting. . Because so much time is spent in the bedroom, it is essential to reduce mite levels there.  . Encase pillows, mattresses, and box springs in special allergen-proof fabric covers or airtight, zippered plastic covers.  . Bedding should be washed weekly in hot water (130 F) and dried in a hot dryer. Allergen-proof covers are available for comforters and pillows that can't be regularly washed.  Wendee Copp the allergy-proof covers every few months. Minimize clutter in the bedroom. Keep pets out of the bedroom.  Marland Kitchen Keep humidity less than 50% by using a dehumidifier or air conditioning. You can buy a humidity measuring device called a hygrometer to monitor this.  . If possible, replace carpets with hardwood, linoleum, or washable area rugs. If that's not possible, vacuum frequently with a vacuum that has a HEPA filter. . Remove all upholstered furniture and non-washable window drapes from the bedroom. . Remove all non-washable stuffed toys from the bedroom.  Wash stuffed toys weekly. Mold Control . Mold and fungi can grow on a variety of surfaces provided certain temperature and moisture conditions exist.  . Outdoor molds grow on plants, decaying vegetation and soil. The major outdoor mold, Alternaria and Cladosporium, are found in very high numbers during hot and dry conditions. Generally, a late summer - fall peak is seen for common outdoor fungal spores. Rain will temporarily lower outdoor mold spore count, but counts rise rapidly when the rainy period ends. . The most important indoor molds are Aspergillus and Penicillium. Dark, humid and poorly ventilated basements are ideal sites for mold growth. The  next most common sites of mold growth are the bathroom and the kitchen. Outdoor (Seasonal) Mold Control . Use air conditioning and keep windows closed. . Avoid exposure to decaying vegetation. Marland Kitchen Avoid leaf raking. . Avoid grain handling. . Consider wearing a  face mask if working in moldy areas.  Indoor (Perennial) Mold Control  . Maintain humidity below 50%. . Get rid of mold growth on hard surfaces with water, detergent and, if necessary, 5% bleach (do not mix with other cleaners). Then dry the area completely. If mold covers an area more than 10 square feet, consider hiring an indoor environmental professional. . For clothing, washing with soap and water is best. If moldy items cannot be cleaned and dried, throw them away. . Remove sources e.g. contaminated carpets. . Repair and seal leaking roofs or pipes. Using dehumidifiers in damp basements may be helpful, but empty the water and clean units regularly to prevent mildew from forming. All rooms, especially basements, bathrooms and kitchens, require ventilation and cleaning to deter mold and mildew growth. Avoid carpeting on concrete or damp floors, and storing items in damp areas.  Skin care recommendations  Bath time: . Always use lukewarm water. AVOID very hot or cold water. Marland Kitchen Keep bathing time to 5-10 minutes. . Do NOT use bubble bath. . Use a mild soap and use just enough to wash the dirty areas. . Do NOT scrub skin vigorously.  . After bathing, pat dry your skin with a towel. Do NOT rub or scrub the skin.  Moisturizers and prescriptions:  . ALWAYS apply moisturizers immediately after bathing (within 3 minutes). This helps to lock-in moisture. . Use the moisturizer several times a day over the whole body. Kermit Balo summer moisturizers include: Aveeno, CeraVe, Cetaphil. Kermit Balo winter moisturizers include: Aquaphor, Vaseline, Cerave, Cetaphil, Eucerin, Vanicream. . When using moisturizers along with medications, the moisturizer should be applied about one hour after applying the medication to prevent diluting effect of the medication or moisturize around where you applied the medications. When not using medications, the moisturizer can be continued twice daily as maintenance.  Laundry and  clothing: . Avoid laundry products with added color or perfumes. . Use unscented hypo-allergenic laundry products such as Tide free, Cheer free & gentle, and All free and clear.  . If the skin still seems dry or sensitive, you can try double-rinsing the clothes. . Avoid tight or scratchy clothing such as wool. . Do not use fabric softeners or dyer sheets.

## 2017-12-07 NOTE — Assessment & Plan Note (Addendum)
Past history - 2017 skin testing was positive to ragweed, [place] grass and Helminthosporium. Interim history - Flonase worsened symptoms, antihistamines worsened RLS and now having increased rhinitis symptoms.  Today's intradermal testing was positive to additional molds and dust mites. Discussed environmental control measures.  Start Nasacort 2 sprays daily and demonstrated proper use.  Start singulair 10mg  daily.   Monitor symptoms. If above regimen does not control symptoms will discuss allergy immunotherapy at next visit.

## 2017-12-29 ENCOUNTER — Telehealth: Payer: Self-pay

## 2017-12-29 NOTE — Telephone Encounter (Signed)
She can take hydroxyzine 10mg  three times a day for the itching as long as it doesn't make her too drowsy. Okay to stop singulair but usually singulair does not cause drowsiness.

## 2017-12-29 NOTE — Telephone Encounter (Signed)
Patient called to advise [place] that she was allergic to montelukast. She informed me that she skipped it last Wednesday night so that she would not be so groggy on Thanksgiving. She informed me that she began itching so bad that she wanted to rip her skin off. She also told me that this is how her reactions normally start. She takes the medication and stops it then begins having issues 1-2 days later.

## 2017-12-29 NOTE — Telephone Encounter (Signed)
Patient informed and advised of this information. She will call back with further questions.

## 2018-02-02 ENCOUNTER — Encounter: Payer: Self-pay | Admitting: Allergy

## 2018-02-02 ENCOUNTER — Ambulatory Visit: Payer: BLUE CROSS/BLUE SHIELD | Admitting: Allergy

## 2018-02-02 VITALS — BP 118/64 | HR 102 | Resp 18

## 2018-02-02 DIAGNOSIS — R05 Cough: Secondary | ICD-10-CM

## 2018-02-02 DIAGNOSIS — L299 Pruritus, unspecified: Secondary | ICD-10-CM

## 2018-02-02 DIAGNOSIS — R0602 Shortness of breath: Secondary | ICD-10-CM | POA: Insufficient documentation

## 2018-02-02 DIAGNOSIS — J3089 Other allergic rhinitis: Secondary | ICD-10-CM

## 2018-02-02 MED ORDER — ALBUTEROL SULFATE HFA 108 (90 BASE) MCG/ACT IN AERS: 2.0000 | INHALATION_SPRAY | Freq: Four times a day (QID) | RESPIRATORY_TRACT | 1 refills | Status: DC | PRN

## 2018-02-02 NOTE — Assessment & Plan Note (Signed)
Past history - Pruritus with no rash. CBC diff and CMP normal in April 2019. Interim history - Better with hydroxyzine.   May take hydroxyzine 10mg  at night 1 hour before bedtime as needed for itching.   Discussed proper skin care measures.

## 2018-02-02 NOTE — Assessment & Plan Note (Signed)
Noticing SOB wit the weather change. Using albuterol BID for the past week with some benefit.  Spirometry today showed: flow volume loops not ideal, no overt abnormalities noted given today's efforts and no significant improvement in FEV1 post bronchodilator treatment however clinically feeling better.  . Daily controller medication(s): Start Qvar 80 2 puffs twice a day and rinse mouth afterwards. Will do a trial of 1 month. . Prior to physical activity: May use albuterol rescue inhaler 2 puffs 5 to 15 minutes prior to strenuous physical activities. Marland Kitchen Rescue medications: May use albuterol rescue inhaler 2 puffs or nebulizer every 4 to 6 hours as needed for shortness of breath, chest tightness, coughing, and wheezing. Monitor frequency of use.

## 2018-02-02 NOTE — Assessment & Plan Note (Signed)
Past history - 2017 skin testing was positive to ragweed, [place] grass and Helminthosporium. Does not tolerate antihistamines due to RLS. 2019 intradermal positive to molds and dust mites. Interim history - did not try Nasacort due to cost. Singulair caused pruritus.   Continue environmental control measures.  Continue Flonase 1 spray twice a day.  Monitor symptoms. If above regimen does not control symptoms will discuss allergy immunotherapy at next visit.

## 2018-02-02 NOTE — Progress Notes (Signed)
Follow Up Note  RE: Deborah Walter MRN: [id number] DOB: August 03, 1960 Date of Office Visit: 02/02/2018  Referring provider: Deloria Lair., MD Primary care provider: Sandi Mealy, MD  Chief Complaint: Allergic Rhinitis  (has been doing better. still some itching in the evening. she believes she is allergic to some kind of spice or tomato. states that she itches like crazy when she eats something specific. )  History of Present Illness: I had the pleasure of seeing Deborah Walter for a follow up visit at the [facility] [facility] on 02/02/2018. She is a 58 y.o. female, who is being followed for allergic rhinitis, pruritus, coughing. Today she is here for regular follow up visit.  She is accompanied today by her sister who provided/contributed to the history. Her previous allergy office visit was on 12/07/2017 with Dr. Maudie Mercury.   Other allergic rhinitis Tried Singulair for a few weeks which caused pruritus so she stopped and the symptoms resolved. Did not try Nasacort because it was too expensive. Currently on Flonase 2 sprays BID with no issues and it seems to be helping.  Foods:  Patient has some issues with itchy nose during meals. Wondering if it's due to tomatoes but just had pizza the other day with no issues.   Pruritus Taking hydroxyzine 10mg  at night 1 hour before bedtime as needed for itching with good benefit. Symptoms usually come on in the late afternoon. Patient does moisturize daily.  SOB Feeling some difficulty to breath with the weather change. Has been using albuterol twice a day for the past week with some benefit but the albuterol is expired.   Assessment and Plan: [name] is a 58 y.o. female with: Other allergic rhinitis Past history - 2017 skin testing was positive to ragweed, [place] grass and Helminthosporium. Does not tolerate antihistamines due to RLS. 2019 intradermal positive to molds and dust mites. Interim history - did not try Nasacort due to cost.  Singulair caused pruritus.   Continue environmental control measures.  Continue Flonase 1 spray twice a day.  Monitor symptoms. If above regimen does not control symptoms will discuss allergy immunotherapy at next visit.   Pruritus Past history - Pruritus with no rash. CBC diff and CMP normal in April 2019. Interim history - Better with hydroxyzine.   May take hydroxyzine 10mg  at night 1 hour before bedtime as needed for itching.   Discussed proper skin care measures.  Shortness of breath Noticing SOB wit the weather change. Using albuterol BID for the past week with some benefit.  Spirometry today showed: flow volume loops not ideal, no overt abnormalities noted given today's efforts and no significant improvement in FEV1 post bronchodilator treatment however clinically feeling better.  . Daily controller medication(s): Start  Qvar 80 2 puffs twice a day and rinse mouth afterwards. Will do a trial of 1 month. . Prior to physical activity: May use albuterol rescue inhaler 2 puffs 5 to 15 minutes prior to strenuous physical activities. Marland Kitchen Rescue medications: May use albuterol rescue inhaler 2 puffs or nebulizer every 4 to 6 hours as needed for shortness of breath, chest tightness, coughing, and wheezing. Monitor frequency of use.   Return in about 4 weeks (around 03/02/2018).  Meds ordered this encounter  Medications  . albuterol (PROVENTIL HFA;VENTOLIN HFA) 108 (90 Base) MCG/ACT inhaler    Sig: Inhale 2 puffs into the lungs every 6 (six) hours as needed for wheezing or shortness of breath.    Dispense:  18 g  Refill:  1   Diagnostics: Spirometry:  Tracings reviewed. Her effort: It was hard to get consistent efforts and there is a question as to whether this reflects a maximal maneuver. FVC: 3.42L FEV1: 2.74L, 89% predicted FEV1/FVC ratio: 80% Interpretation: flow volume loops not ideal, no overt abnormalities noted given today's efforts and no significant improvement in FEV1  post bronchodilator treatment however clinically feeling better.  Please see scanned spirometry results for details.  Medication List:  Current Outpatient Medications  Medication Sig Dispense Refill  . albuterol (PROVENTIL HFA;VENTOLIN HFA) 108 (90 Base) MCG/ACT inhaler Inhale 2 puffs into the lungs every 6 (six) hours as needed for wheezing or shortness of breath. 18 g 1  . Calcium Carb-Cholecalciferol (CALCIUM 1000 + D PO) Take by mouth daily.    . famotidine (PEPCID) 40 MG tablet Take by mouth.    . fluticasone (FLONASE) 50 MCG/ACT nasal spray Place into both nostrils daily.    . hydrOXYzine (ATARAX/VISTARIL) 10 MG tablet Take 1 tablet (10 mg total) by mouth at bedtime as needed for itching. 30 tablet 5  . lisinopril-hydrochlorothiazide (PRINZIDE,ZESTORETIC) 20-25 MG tablet Take 1 tablet by mouth daily.    Marland Kitchen omeprazole (PRILOSEC) 20 MG capsule Take 20 mg by mouth as needed.   0  . oxyCODONE-acetaminophen (PERCOCET/ROXICET) 5-325 MG tablet Take 1 tablet by mouth every 4 (four) hours as needed for severe pain. 20 tablet 0   No current facility-administered medications for this visit.    Allergies: Allergies  Allergen Reactions  . Amoxicillin Swelling    Facial swelling and severe itching  . Atorvastatin Other (See Comments)  . Diclofenac     Caused dry mouth  . Gramineae Pollens     unknown  . Other     ALMONDS.  CAUSE MIGRAINE HEADACHE unknown  . Pravastatin Other (See Comments)  . Cefuroxime Rash  . Chocolate Itching   I reviewed her past medical history, social history, family history, and environmental history and no significant changes have been reported from previous visit on 12/07/2017.  Review of Systems  Constitutional: Negative for appetite change, chills, fever and unexpected weight change.  HENT: Positive for congestion and rhinorrhea.   Eyes: Negative for itching.  Respiratory: Positive for shortness of breath. Negative for chest tightness and wheezing.     Gastrointestinal: Negative for abdominal pain.  Skin: Negative for rash.  Allergic/Immunologic: Positive for environmental allergies.  Neurological: Positive for headaches.   Objective: BP 118/64 (BP Location: Left Arm, Patient Position: Sitting, Cuff Size: Normal)   Pulse (!) 102   Resp 18   SpO2 96%  There is no height or weight on file to calculate BMI. Physical Exam  Constitutional: She is oriented to person, place, and time. She appears well-developed and well-nourished.  HENT:  Head: Normocephalic and atraumatic.  Right Ear: External ear normal.  Left Ear: External ear normal.  Nose: Nose normal.  Mouth/Throat: Oropharynx is clear and moist.  Eyes: Conjunctivae and EOM are normal.  Neck: Neck supple.  Cardiovascular: Normal rate, regular rhythm and normal heart sounds. Exam reveals no gallop and no friction rub.  No murmur heard. Pulmonary/Chest: Effort normal and breath sounds normal. She has no wheezes. She has no rales.  Neurological: She is alert and oriented to person, place, and time.  Skin: Skin is warm. No rash noted.  Psychiatric: She has a normal mood and affect. Her behavior is normal.  Nursing note and vitals reviewed.  Previous notes and tests were reviewed. The plan  was reviewed with the patient/family, and all questions/concerned were addressed.  It was my pleasure to see Avarose today and participate in her care. Please feel free to contact me with any questions or concerns.  Sincerely,  Rexene Alberts, DO Allergy & Immunology  [facility] office: [phone] [facility] office: [phone]

## 2018-02-02 NOTE — Patient Instructions (Addendum)
Allergic rhinitis:  Continue environmental control measures. Testing in the past positive to molds, dust mites, ragweed, [place] grass and Helminthosporium.  Continue Flonase 1 spray twice a day.  Monitor symptoms. If above regimen does not control symptoms will discuss allergy immunotherapy at next visit.   Itching:  May take hydroxyzine 10mg  at night 1 hour before bedtime as needed for itching.   Continue proper skin care measures.   Avoid tomato based foods.  Keep a food journal to see if you can identify any other trigger foods so we can test for at the next visit.  Breathing: . Daily controller medication(s): Start qvar 80 2 puffs twice a day and rinse mouth afterwards. . Prior to physical activity: May use albuterol rescue inhaler 2 puffs 5 to 15 minutes prior to strenuous physical activities. Marland Kitchen Rescue medications: May use albuterol rescue inhaler 2 puffs or nebulizer every 4 to 6 hours as needed for shortness of breath, chest tightness, coughing, and wheezing. Monitor frequency of use.  . Control goals:  o Full participation in all desired activities (may need albuterol before activity) o Albuterol use two times or less a week on average (not counting use with activity) o Cough interfering with sleep two times or less a month o Oral steroids no more than once a year o No hospitalizations  Follow up in 1 month  Skin care recommendations  Bath time: . Always use lukewarm water. AVOID very hot or cold water. Marland Kitchen Keep bathing time to 5-10 minutes. . Do NOT use bubble bath. . Use a mild soap and use just enough to wash the dirty areas. . Do NOT scrub skin vigorously.  . After bathing, pat dry your skin with a towel. Do NOT rub or scrub the skin.  Moisturizers and prescriptions:  . ALWAYS apply moisturizers immediately after bathing (within 3 minutes). This helps to lock-in moisture. . Use the moisturizer several times a day over the whole body. Kermit Balo summer moisturizers  include: Aveeno, CeraVe, Cetaphil. Kermit Balo winter moisturizers include: Aquaphor, Vaseline, Cerave, Cetaphil, Eucerin, Vanicream. . When using moisturizers along with medications, the moisturizer should be applied about one hour after applying the medication to prevent diluting effect of the medication or moisturize around where you applied the medications. When not using medications, the moisturizer can be continued twice daily as maintenance.  Laundry and clothing: . Avoid laundry products with added color or perfumes. . Use unscented hypo-allergenic laundry products such as Tide free, Cheer free & gentle, and All free and clear.  . If the skin still seems dry or sensitive, you can try double-rinsing the clothes. . Avoid tight or scratchy clothing such as wool. . Do not use fabric softeners or dyer sheets.

## 2018-02-03 ENCOUNTER — Encounter: Payer: Self-pay | Admitting: Gastroenterology

## 2018-02-03 ENCOUNTER — Ambulatory Visit: Payer: BLUE CROSS/BLUE SHIELD | Admitting: Gastroenterology

## 2018-02-03 DIAGNOSIS — R195 Other fecal abnormalities: Secondary | ICD-10-CM | POA: Diagnosis not present

## 2018-02-03 NOTE — Assessment & Plan Note (Signed)
Very pleasant 58 year old female presenting for positive Cologuard test.  No prior colonoscopy.  Occasional bright red blood per rectum with hard stool.  Chronically on oxycodone for back pain.  Typically has regular bowel movements.  Reflux well controlled.  No upper GI symptoms.  Plan for colonoscopy in the near future.  Plan for deep sedation given chronic pain medication use. I have discussed the risks, alternatives, benefits with regards to but not limited to the risk of reaction to medication, bleeding, infection, perforation and the patient is agreeable to proceed. Written consent to be obtained.

## 2018-02-03 NOTE — Progress Notes (Addendum)
Primary Care Physician:  Sandi Mealy, MD  Primary Gastroenterologist:  Garfield Cornea, MD   Chief Complaint  Patient presents with  . + cologuard    Never had TCS    HPI:  Deborah Walter is a 58 y.o. female here at the request of Dr. Lorra Hals for further evaluation of positive Cologuard test done back in August 2019.  Patient has never had a colonoscopy.  No family history of colon cancer or colon polyps.  She is on chronic pain medication for her back.  Stools are somewhat harder at times.  Has a daily bowel movement however.  If a hard stool, occasionally will see fresh blood on the toilet tissue with wiping.  Denies any abdominal pain.  Heartburn is well controlled with current regimen of Pepcid and Prilosec.  No dysphagia, vomiting.  No unintentional weight loss.    Current Outpatient Medications  Medication Sig Dispense Refill  . albuterol (PROVENTIL HFA;VENTOLIN HFA) 108 (90 Base) MCG/ACT inhaler Inhale 2 puffs into the lungs every 6 (six) hours as needed for wheezing or shortness of breath. 18 g 1  . beclomethasone (QVAR) 80 MCG/ACT inhaler Inhale 2 puffs into the lungs 2 (two) times daily.    . Calcium Carb-Cholecalciferol (CALCIUM 1000 + D PO) Take by mouth daily.    . famotidine (PEPCID) 40 MG tablet Take 40 mg by mouth daily.     . fluticasone (FLONASE) 50 MCG/ACT nasal spray Place into both nostrils daily.    . hydrOXYzine (ATARAX/VISTARIL) 10 MG tablet Take 1 tablet (10 mg total) by mouth at bedtime as needed for itching. 30 tablet 5  . lisinopril-hydrochlorothiazide (PRINZIDE,ZESTORETIC) 20-25 MG tablet Take 1 tablet by mouth daily.    Marland Kitchen omeprazole (PRILOSEC) 20 MG capsule Take 20 mg by mouth daily.   0  . oxyCODONE-acetaminophen (PERCOCET/ROXICET) 5-325 MG tablet Take 1 tablet by mouth every 4 (four) hours as needed for severe pain. 20 tablet 0   No current facility-administered medications for this visit.     Allergies as of 02/03/2018 - Review Complete 02/03/2018   Allergen Reaction Noted  . Amoxicillin Swelling 03/04/2015  . Atorvastatin Other (See Comments) 04/26/2017  . Diclofenac  08/03/2017  . Dust mite extract  02/03/2018  . Gramineae pollens  11/19/2016  . Other  03/04/2015  . Pravastatin Other (See Comments) 04/26/2017  . Cefuroxime Rash 08/26/2015  . Chocolate Itching 11/17/2015    Past Medical History:  Diagnosis Date  . Acid reflux   . Elevated cholesterol   . Hypertension     Past Surgical History:  Procedure Laterality Date  . CLAVICLE SURGERY Right Early 1990  . CYSTECTOMY  Early 1990   Bottom of spine  . NASAL SEPTOPLASTY W/ TURBINOPLASTY Bilateral 09/01/2015   Procedure: NASAL SEPTOPLASTY WITH TURBINATE REDUCTION;  Surgeon: Leta Baptist, MD;  Location: [place];  Service: ENT;  Laterality: Bilateral;  . SINUS ENDO WITH FUSION Left 09/01/2015   Procedure: LEFT ENDOSCOPIC TOTAL ETHMOIDECTOMY LEFT ENDOSCOPIC  MAXILLARY ANTROSTOMY LEFT ENDOSCOPIC FRONTAL RECESS EXPLORATION;  Surgeon: Leta Baptist, MD;  Location: [place];  Service: ENT;  Laterality: Left;  . TUBAL LIGATION      Family History  Problem Relation Age of Onset  . Asthma Father   . Asthma Sister   . Allergic rhinitis Neg Hx   . Eczema Neg Hx   . Immunodeficiency Neg Hx   . Urticaria Neg Hx   . Angioedema Neg Hx   . Atopy Neg  Hx   . Colon cancer Neg Hx   . Colon polyps Neg Hx     Social History   Socioeconomic History  . Marital status: Single    Spouse name: Not on file  . Number of children: Not on file  . Years of education: Not on file  . Highest education level: Not on file  Occupational History  . Not on file  Social Needs  . Financial resource strain: Not on file  . Food insecurity:    Worry: Not on file    Inability: Not on file  . Transportation needs:    Medical: Not on file    Non-medical: Not on file  Tobacco Use  . Smoking status: Former Smoker    Types: Cigarettes    Last attempt to quit: 09/14/2014     Years since quitting: 3.3  . Smokeless tobacco: Never Used  Substance and Sexual Activity  . Alcohol use: Yes    Alcohol/week: 0.0 standard drinks    Comment: rare  . Drug use: No  . Sexual activity: Not Currently    Birth control/protection: Post-menopausal  Lifestyle  . Physical activity:    Days per week: Not on file    Minutes per session: Not on file  . Stress: Not on file  Relationships  . Social connections:    Talks on phone: Not on file    Gets together: Not on file    Attends religious service: Not on file    Active member of club or organization: Not on file    Attends meetings of clubs or organizations: Not on file    Relationship status: Not on file  . Intimate partner violence:    Fear of current or ex partner: Not on file    Emotionally abused: Not on file    Physically abused: Not on file    Forced sexual activity: Not on file  Other Topics Concern  . Not on file  Social History Narrative  . Not on file      ROS:  General: Negative for anorexia, weight loss, fever, chills, fatigue, weakness. Eyes: Negative for vision changes.  ENT: Negative for hoarseness, difficulty swallowing , nasal congestion. CV: Negative for chest pain, angina, palpitations, dyspnea on exertion, peripheral edema.  Respiratory: Negative for dyspnea at rest, dyspnea on exertion, cough, sputum, wheezing.  GI: See history of present illness. GU:  Negative for dysuria, hematuria, urinary incontinence, urinary frequency, nocturnal urination.  MS: Negative for joint pain, positive low back pain.  Derm: Negative for rash or itching.  Neuro: Negative for weakness, abnormal sensation, seizure, frequent headaches, memory loss, confusion.  Psych: Negative for anxiety, depression, suicidal ideation, hallucinations.  Endo: Negative for unusual weight change.  Heme: Negative for bruising or bleeding. Allergy: Negative for rash or hives.    Physical Examination:  BP 138/84   Pulse (!) 110    Temp (!) 97.1 F (36.2 C) (Oral)   Ht 5\' 9"  (1.753 m)   Wt 224 lb 6.4 oz (101.8 kg)   BMI 33.14 kg/m    General: Well-nourished, well-developed in no acute distress.  Head: Normocephalic, atraumatic.   Eyes: Conjunctiva pink, no icterus. Mouth: Oropharyngeal mucosa moist and pink , no lesions erythema or exudate. Neck: Supple without thyromegaly, masses, or lymphadenopathy.  Lungs: Clear to auscultation bilaterally.  Heart: Regular rate and rhythm, no murmurs rubs or gallops.  Abdomen: Bowel sounds are normal, nontender, nondistended, no hepatosplenomegaly or masses, no abdominal bruits or  hernia , no rebound or guarding.   Rectal: Not performed Extremities: No lower extremity edema. No clubbing or deformities.  Neuro: Alert and oriented x 4 , grossly normal neurologically.  Skin: Warm and dry, no rash or jaundice.   Psych: Alert and cooperative, normal mood and affect.  Labs:  Labs from April 2019, hemoglobin 13.5, hematocrit 40.6, albumin 4.9, total bilirubin 0.5, alkaline phosphatase 75, AST 28, ALT 29, albumin 4.9 Imaging Studies: No results found.

## 2018-02-03 NOTE — H&P (View-Only) (Signed)
Primary Care Physician:  Sandi Mealy, MD  Primary Gastroenterologist:  Garfield Cornea, MD   Chief Complaint  Patient presents with  . + cologuard    Never had TCS    HPI:  Deborah Walter is a 58 y.o. female here at the request of Dr. Lorra Hals for further evaluation of positive Cologuard test done back in August 2019.  Patient has never had a colonoscopy.  No family history of colon cancer or colon polyps.  She is on chronic pain medication for her back.  Stools are somewhat harder at times.  Has a daily bowel movement however.  If a hard stool, occasionally will see fresh blood on the toilet tissue with wiping.  Denies any abdominal pain.  Heartburn is well controlled with current regimen of Pepcid and Prilosec.  No dysphagia, vomiting.  No unintentional weight loss.    Current Outpatient Medications  Medication Sig Dispense Refill  . albuterol (PROVENTIL HFA;VENTOLIN HFA) 108 (90 Base) MCG/ACT inhaler Inhale 2 puffs into the lungs every 6 (six) hours as needed for wheezing or shortness of breath. 18 g 1  . beclomethasone (QVAR) 80 MCG/ACT inhaler Inhale 2 puffs into the lungs 2 (two) times daily.    . Calcium Carb-Cholecalciferol (CALCIUM 1000 + D PO) Take by mouth daily.    . famotidine (PEPCID) 40 MG tablet Take 40 mg by mouth daily.     . fluticasone (FLONASE) 50 MCG/ACT nasal spray Place into both nostrils daily.    . hydrOXYzine (ATARAX/VISTARIL) 10 MG tablet Take 1 tablet (10 mg total) by mouth at bedtime as needed for itching. 30 tablet 5  . lisinopril-hydrochlorothiazide (PRINZIDE,ZESTORETIC) 20-25 MG tablet Take 1 tablet by mouth daily.    Marland Kitchen omeprazole (PRILOSEC) 20 MG capsule Take 20 mg by mouth daily.   0  . oxyCODONE-acetaminophen (PERCOCET/ROXICET) 5-325 MG tablet Take 1 tablet by mouth every 4 (four) hours as needed for severe pain. 20 tablet 0   No current facility-administered medications for this visit.     Allergies as of 02/03/2018 - Review Complete 02/03/2018   Allergen Reaction Noted  . Amoxicillin Swelling 03/04/2015  . Atorvastatin Other (See Comments) 04/26/2017  . Diclofenac  08/03/2017  . Dust mite extract  02/03/2018  . Gramineae pollens  11/19/2016  . Other  03/04/2015  . Pravastatin Other (See Comments) 04/26/2017  . Cefuroxime Rash 08/26/2015  . Chocolate Itching 11/17/2015    Past Medical History:  Diagnosis Date  . Acid reflux   . Elevated cholesterol   . Hypertension     Past Surgical History:  Procedure Laterality Date  . CLAVICLE SURGERY Right Early 1990  . CYSTECTOMY  Early 1990   Bottom of spine  . NASAL SEPTOPLASTY W/ TURBINOPLASTY Bilateral 09/01/2015   Procedure: NASAL SEPTOPLASTY WITH TURBINATE REDUCTION;  Surgeon: Leta Baptist, MD;  Location: [place];  Service: ENT;  Laterality: Bilateral;  . SINUS ENDO WITH FUSION Left 09/01/2015   Procedure: LEFT ENDOSCOPIC TOTAL ETHMOIDECTOMY LEFT ENDOSCOPIC  MAXILLARY ANTROSTOMY LEFT ENDOSCOPIC FRONTAL RECESS EXPLORATION;  Surgeon: Leta Baptist, MD;  Location: [place];  Service: ENT;  Laterality: Left;  . TUBAL LIGATION      Family History  Problem Relation Age of Onset  . Asthma Father   . Asthma Sister   . Allergic rhinitis Neg Hx   . Eczema Neg Hx   . Immunodeficiency Neg Hx   . Urticaria Neg Hx   . Angioedema Neg Hx   . Atopy Neg  Hx   . Colon cancer Neg Hx   . Colon polyps Neg Hx     Social History   Socioeconomic History  . Marital status: Single    Spouse name: Not on file  . Number of children: Not on file  . Years of education: Not on file  . Highest education level: Not on file  Occupational History  . Not on file  Social Needs  . Financial resource strain: Not on file  . Food insecurity:    Worry: Not on file    Inability: Not on file  . Transportation needs:    Medical: Not on file    Non-medical: Not on file  Tobacco Use  . Smoking status: Former Smoker    Types: Cigarettes    Last attempt to quit: 09/14/2014     Years since quitting: 3.3  . Smokeless tobacco: Never Used  Substance and Sexual Activity  . Alcohol use: Yes    Alcohol/week: 0.0 standard drinks    Comment: rare  . Drug use: No  . Sexual activity: Not Currently    Birth control/protection: Post-menopausal  Lifestyle  . Physical activity:    Days per week: Not on file    Minutes per session: Not on file  . Stress: Not on file  Relationships  . Social connections:    Talks on phone: Not on file    Gets together: Not on file    Attends religious service: Not on file    Active member of club or organization: Not on file    Attends meetings of clubs or organizations: Not on file    Relationship status: Not on file  . Intimate partner violence:    Fear of current or ex partner: Not on file    Emotionally abused: Not on file    Physically abused: Not on file    Forced sexual activity: Not on file  Other Topics Concern  . Not on file  Social History Narrative  . Not on file      ROS:  General: Negative for anorexia, weight loss, fever, chills, fatigue, weakness. Eyes: Negative for vision changes.  ENT: Negative for hoarseness, difficulty swallowing , nasal congestion. CV: Negative for chest pain, angina, palpitations, dyspnea on exertion, peripheral edema.  Respiratory: Negative for dyspnea at rest, dyspnea on exertion, cough, sputum, wheezing.  GI: See history of present illness. GU:  Negative for dysuria, hematuria, urinary incontinence, urinary frequency, nocturnal urination.  MS: Negative for joint pain, positive low back pain.  Derm: Negative for rash or itching.  Neuro: Negative for weakness, abnormal sensation, seizure, frequent headaches, memory loss, confusion.  Psych: Negative for anxiety, depression, suicidal ideation, hallucinations.  Endo: Negative for unusual weight change.  Heme: Negative for bruising or bleeding. Allergy: Negative for rash or hives.    Physical Examination:  BP 138/84   Pulse (!) 110    Temp (!) 97.1 F (36.2 C) (Oral)   Ht 5\' 9"  (1.753 m)   Wt 224 lb 6.4 oz (101.8 kg)   BMI 33.14 kg/m    General: Well-nourished, well-developed in no acute distress.  Head: Normocephalic, atraumatic.   Eyes: Conjunctiva pink, no icterus. Mouth: Oropharyngeal mucosa moist and pink , no lesions erythema or exudate. Neck: Supple without thyromegaly, masses, or lymphadenopathy.  Lungs: Clear to auscultation bilaterally.  Heart: Regular rate and rhythm, no murmurs rubs or gallops.  Abdomen: Bowel sounds are normal, nontender, nondistended, no hepatosplenomegaly or masses, no abdominal bruits or  hernia , no rebound or guarding.   Rectal: Not performed Extremities: No lower extremity edema. No clubbing or deformities.  Neuro: Alert and oriented x 4 , grossly normal neurologically.  Skin: Warm and dry, no rash or jaundice.   Psych: Alert and cooperative, normal mood and affect.  Labs:  Labs from April 2019, hemoglobin 13.5, hematocrit 40.6, albumin 4.9, total bilirubin 0.5, alkaline phosphatase 75, AST 28, ALT 29, albumin 4.9 Imaging Studies: No results found.

## 2018-02-03 NOTE — Patient Instructions (Signed)
We will call you Monday to schedule your colonoscopy.

## 2018-02-06 ENCOUNTER — Telehealth: Payer: Self-pay

## 2018-02-06 ENCOUNTER — Other Ambulatory Visit: Payer: Self-pay

## 2018-02-06 ENCOUNTER — Ambulatory Visit: Payer: BLUE CROSS/BLUE SHIELD | Admitting: Allergy

## 2018-02-06 DIAGNOSIS — R195 Other fecal abnormalities: Secondary | ICD-10-CM

## 2018-02-06 MED ORDER — NA SULFATE-K SULFATE-MG SULF 17.5-3.13-1.6 GM/177ML PO SOLN: 1.0000 | ORAL | 0 refills | Status: DC

## 2018-02-06 NOTE — Telephone Encounter (Signed)
Called and informed pt of pre-op appt 02/22/18 at 1:45pm. Letter mailed with procedure instructions.

## 2018-02-06 NOTE — Progress Notes (Signed)
CC'D TO PCP °

## 2018-02-06 NOTE — Telephone Encounter (Signed)
Called pt this morning, TCS w/Propofol w/RMR scheduled for 02/27/18 at 8:30am. Rx for prep sent to pharmacy. Orders entered. Will mail instructions after pre-op appt is scheduled.

## 2018-02-20 NOTE — Patient Instructions (Signed)
Deborah Walter  02/20/2018     @PREFPERIOPPHARMACY @   Your procedure is scheduled on  02/27/2018.  Report to [facility] at  700   A.M.  Call this number if you have problems the morning of surgery:  [phone]   Remember:  Follow the diet and prep instructions given to you by Dr Roseanne Kaufman office.                    Take these medicines the morning of surgery with A SIP OF WATER  Lisinopril, prilosec, oxycodone ( if needed).    Do not wear jewelry, make-up or nail polish.  Do not wear lotions, powders, or perfumes, or deodorant.  Do not shave 48 hours prior to surgery.  Men may shave face and neck.  Do not bring valuables to the hospital.  [facility] is not responsible for any belongings or valuables.  Contacts, dentures or bridgework may not be worn into surgery.  Leave your suitcase in the car.  After surgery it may be brought to your room.  For patients admitted to the hospital, discharge time will be determined by your treatment team.  Patients discharged the day of surgery will not be allowed to drive home.   Name and phone number of your driver:   family Special instructions:  None  Please read over the following fact sheets that you were given. Anesthesia Post-op Instructions and Care and Recovery After Surgery       Colonoscopy, Adult A colonoscopy is an exam to look at the large intestine. It is done to check for problems, such as:  Lumps (tumors).  Growths (polyps).  Swelling (inflammation).  Bleeding. What happens before the procedure? Eating and drinking Follow instructions from your doctor about eating and drinking. These instructions may include:  A few days before the procedure - follow a low-fiber diet. ? Avoid nuts. ? Avoid seeds. ? Avoid dried fruit. ? Avoid raw fruits. ? Avoid vegetables.  1-3 days before the procedure - follow a clear liquid diet. Avoid liquids that have red or purple dye. Drink only clear liquids, such  as: ? Clear broth or bouillon. ? Black coffee or tea. ? Clear juice. ? Clear soft drinks or sports drinks. ? Gelatin dessert. ? Popsicles.  On the day of the procedure - do not eat or drink anything during the 2 hours before the procedure. Up to 2 hours before the procedure, you may continue to drink clear liquids, such as water or clear fruit juice.  Bowel prep If you were prescribed an oral bowel prep:  Take it as told by your doctor. Starting the day before your procedure, you will need to drink a lot of liquid. The liquid will cause you to poop (have bowel movements) until your poop is almost clear or light green.  To clean out your colon, you may also be given: ? Laxative medicines. ? Instructions about how to use an enema.  If your skin or butt gets irritated from diarrhea, you may: ? Wipe the area with wipes that have medicine in them, such as adult wet wipes with aloe and vitamin E. ? Put something on your skin that soothes the area, such as petroleum jelly.  If you throw up (vomit) while drinking the bowel prep, take a break for up to 60 minutes. Then begin the bowel prep again. If you keep throwing up and you cannot take the bowel  prep without throwing up, call your doctor. General instructions  Ask your doctor about: ? Changing or stopping your normal medicines. This is important if you take iron pills, diabetes medicines, or blood thinners. ? Taking medicines such as aspirin and ibuprofen. These medicines can thin your blood. Do not take these medicines unless your doctor tells you to take them.  Plan to have someone take you home from the hospital or clinic. What happens during the procedure?   An IV tube may be put into one of your veins.  You will be given medicine to help you relax (sedative).  To reduce your risk of infection: ? Your doctors will wash their hands. ? Your anal area will be washed with soap.  You will be asked to lie on your side with your  knees bent.  Your doctor will get a long, thin, flexible tube ready. The tube will have a camera and a light on the end.  The tube will be put into your anus.  The tube will be gently put into your large intestine.  Air will be delivered into your large intestine to keep it open. You may feel some pressure or cramping.  The camera will be used to take photos.  A small tissue sample may be removed for testing (biopsy).  If small growths are found, your doctor may remove them and have them checked for cancer.  The tube that was put into your anus will be slowly removed. The procedure may vary among doctors and hospitals. What happens after the procedure?  Your doctor will check on you often until the medicines you were given have worn off.  Do not drive for 24 hours after the procedure.  You may have a small amount of blood in your poop.  You may pass gas.  You may have mild cramps or bloating in your belly (abdomen).  It is up to you to get the results of your procedure. Ask your doctor, or the department performing the procedure, when your results will be ready. Summary  A colonoscopy is an exam to look at the large intestine.  Follow instructions from your doctor about eating and drinking before the procedure.  If you were prescribed an oral bowel prep to clean out your colon, take it as told by your doctor.  Your doctor will check on you often until the medicines you were given have worn off.  Plan to have someone take you home from the hospital or clinic. This information is not intended to replace advice given to you by your health care provider. Make sure you discuss any questions you have with your health care provider. Document Released: 02/13/2010 Document Revised: 11/10/2016 Document Reviewed: 03/25/2015 Elsevier Interactive Patient Education  2019 [employer]  Colonoscopy, Adult, Care After This sheet gives you information about how to care for yourself  after your procedure. Your health care provider may also give you more specific instructions. If you have problems or questions, contact your health care provider. What can I expect after the procedure? After the procedure, it is common to have:  A small amount of blood in your stool for 24 hours after the procedure.  Some gas.  Mild abdominal cramping or bloating. Follow these instructions at home: General instructions  For the first 24 hours after the procedure: ? Do not drive or use machinery. ? Do not sign important documents. ? Do not drink alcohol. ? Do your regular daily activities at a slower pace than  normal. ? Eat soft, easy-to-digest foods.  Take over-the-counter or prescription medicines only as told by your health care provider. Relieving cramping and bloating   Try walking around when you have cramps or feel bloated.  Apply heat to your abdomen as told by your health care provider. Use a heat source that your health care provider recommends, such as a moist heat pack or a heating pad. ? Place a towel between your skin and the heat source. ? Leave the heat on for 20-30 minutes. ? Remove the heat if your skin turns bright red. This is especially important if you are unable to feel pain, heat, or cold. You may have a greater risk of getting burned. Eating and drinking   Drink enough fluid to keep your urine pale yellow.  Resume your normal diet as instructed by your health care provider. Avoid heavy or fried foods that are hard to digest.  Avoid drinking alcohol for as long as instructed by your health care provider. Contact a health care provider if:  You have blood in your stool 2-3 days after the procedure. Get help right away if:  You have more than a small spotting of blood in your stool.  You pass large blood clots in your stool.  Your abdomen is swollen.  You have nausea or vomiting.  You have a fever.  You have increasing abdominal pain that is  not relieved with medicine. Summary  After the procedure, it is common to have a small amount of blood in your stool. You may also have mild abdominal cramping and bloating.  For the first 24 hours after the procedure, do not drive or use machinery, sign important documents, or drink alcohol.  Contact your health care provider if you have a lot of blood in your stool, nausea or vomiting, a fever, or increased abdominal pain. This information is not intended to replace advice given to you by your health care provider. Make sure you discuss any questions you have with your health care provider. Document Released: 08/26/2003 Document Revised: 11/03/2016 Document Reviewed: 03/25/2015 Elsevier Interactive Patient Education  2019 [place] Anesthesia is a term that refers to techniques, procedures, and medicines that help a person stay safe and comfortable during a medical procedure. Monitored anesthesia care, or sedation, is one type of anesthesia. Your anesthesia specialist may recommend sedation if you will be having a procedure that does not require you to be unconscious, such as:  Cataract surgery.  A dental procedure.  A biopsy.  A colonoscopy. During the procedure, you may receive a medicine to help you relax (sedative). There are three levels of sedation:  Mild sedation. At this level, you may feel awake and relaxed. You will be able to follow directions.  Moderate sedation. At this level, you will be sleepy. You may not remember the procedure.  Deep sedation. At this level, you will be asleep. You will not remember the procedure. The more medicine you are given, the deeper your level of sedation will be. Depending on how you respond to the procedure, the anesthesia specialist may change your level of sedation or the type of anesthesia to fit your needs. An anesthesia specialist will monitor you closely during the procedure. Let your health care  provider know about:  Any allergies you have.  All medicines you are taking, including vitamins, herbs, eye drops, creams, and over-the-counter medicines.  Any use of steroids (by mouth or as a cream).  Any problems you  or family members have had with sedatives and anesthetic medicines.  Any blood disorders you have.  Any surgeries you have had.  Any medical conditions you have, such as sleep apnea.  Whether you are pregnant or may be pregnant.  Any use of cigarettes, alcohol, or street drugs. What are the risks? Generally, this is a safe procedure. However, problems may occur, including:  Getting too much medicine (oversedation).  Nausea.  Allergic reaction to medicines.  Trouble breathing. If this happens, a breathing tube may be used to help with breathing. It will be removed when you are awake and breathing on your own.  Heart trouble.  Lung trouble. Before the procedure Staying hydrated Follow instructions from your health care provider about hydration, which may include:  Up to 2 hours before the procedure - you may continue to drink clear liquids, such as water, clear fruit juice, black coffee, and plain tea. Eating and drinking restrictions Follow instructions from your health care provider about eating and drinking, which may include:  8 hours before the procedure - stop eating heavy meals or foods such as meat, fried foods, or fatty foods.  6 hours before the procedure - stop eating light meals or foods, such as toast or cereal.  6 hours before the procedure - stop drinking milk or drinks that contain milk.  2 hours before the procedure - stop drinking clear liquids. Medicines Ask your health care provider about:  Changing or stopping your regular medicines. This is especially important if you are taking diabetes medicines or blood thinners.  Taking medicines such as aspirin and ibuprofen. These medicines can thin your blood. Do not take these medicines  before your procedure if your health care provider instructs you not to. Tests and exams  You will have a physical exam.  You may have blood tests done to show: ? How well your kidneys and liver are working. ? How well your blood can clot. General instructions  Plan to have someone take you home from the hospital or clinic.  If you will be going home right after the procedure, plan to have someone with you for 24 hours.  What happens during the procedure?  Your blood pressure, heart rate, breathing, level of pain and overall condition will be monitored.  An IV tube will be inserted into one of your veins.  Your anesthesia specialist will give you medicines as needed to keep you comfortable during the procedure. This may mean changing the level of sedation.  The procedure will be performed. After the procedure  Your blood pressure, heart rate, breathing rate, and blood oxygen level will be monitored until the medicines you were given have worn off.  Do not drive for 24 hours if you received a sedative.  You may: ? Feel sleepy, clumsy, or nauseous. ? Feel forgetful about what happened after the procedure. ? Have a sore throat if you had a breathing tube during the procedure. ? Vomit. This information is not intended to replace advice given to you by your health care provider. Make sure you discuss any questions you have with your health care provider. Document Released: 10/07/2004 Document Revised: 06/20/2015 Document Reviewed: 05/04/2015 Elsevier Interactive Patient Education  2019 [place], Care After These instructions provide you with information about caring for yourself after your procedure. Your health care provider may also give you more specific instructions. Your treatment has been planned according to current medical practices, but problems sometimes occur. Call your health care  provider if you have any problems or questions after your  procedure. What can I expect after the procedure? After your procedure, you may:  Feel sleepy for several hours.  Feel clumsy and have poor balance for several hours.  Feel forgetful about what happened after the procedure.  Have poor judgment for several hours.  Feel nauseous or vomit.  Have a sore throat if you had a breathing tube during the procedure. Follow these instructions at home: For at least 24 hours after the procedure:      Have a responsible adult stay with you. It is important to have someone help care for you until you are awake and alert.  Rest as needed.  Do not: ? Participate in activities in which you could fall or become injured. ? Drive. ? Use heavy machinery. ? Drink alcohol. ? Take sleeping pills or medicines that cause drowsiness. ? Make important decisions or sign legal documents. ? Take care of children on your own. Eating and drinking  Follow the diet that is recommended by your health care provider.  If you vomit, drink water, juice, or soup when you can drink without vomiting.  Make sure you have little or no nausea before eating solid foods. General instructions  Take over-the-counter and prescription medicines only as told by your health care provider.  If you have sleep apnea, surgery and certain medicines can increase your risk for breathing problems. Follow instructions from your health care provider about wearing your sleep device: ? Anytime you are sleeping, including during daytime naps. ? While taking prescription pain medicines, sleeping medicines, or medicines that make you drowsy.  If you smoke, do not smoke without supervision.  Keep all follow-up visits as told by your health care provider. This is important. Contact a health care provider if:  You keep feeling nauseous or you keep vomiting.  You feel light-headed.  You develop a rash.  You have a fever. Get help right away if:  You have trouble  breathing. Summary  For several hours after your procedure, you may feel sleepy and have poor judgment.  Have a responsible adult stay with you for at least 24 hours or until you are awake and alert. This information is not intended to replace advice given to you by your health care provider. Make sure you discuss any questions you have with your health care provider. Document Released: 05/04/2015 Document Revised: 08/27/2016 Document Reviewed: 05/04/2015 Elsevier Interactive Patient Education  2019 [employer].

## 2018-02-22 ENCOUNTER — Encounter (HOSPITAL_COMMUNITY)
Admission: RE | Admit: 2018-02-22 | Discharge: 2018-02-22 | Disposition: A | Discharge status: Home / Self Care | Payer: BLUE CROSS/BLUE SHIELD | Source: Ambulatory Visit | Attending: Internal Medicine | Admitting: Internal Medicine

## 2018-02-22 ENCOUNTER — Other Ambulatory Visit: Payer: Self-pay

## 2018-02-22 ENCOUNTER — Encounter (HOSPITAL_COMMUNITY): Payer: Self-pay

## 2018-02-22 DIAGNOSIS — Z01818 Encounter for other preprocedural examination: Secondary | ICD-10-CM | POA: Insufficient documentation

## 2018-02-22 DIAGNOSIS — I10 Essential (primary) hypertension: Secondary | ICD-10-CM | POA: Insufficient documentation

## 2018-02-22 LAB — CBC
HCT: 42.2 % (ref 36.0–46.0)
HEMOGLOBIN: 13.3 g/dL (ref 12.0–15.0)
MCH: 29.9 pg (ref 26.0–34.0)
MCHC: 31.5 g/dL (ref 30.0–36.0)
MCV: 94.8 fL (ref 80.0–100.0)
Platelets: 265 10*3/uL (ref 150–400)
RBC: 4.45 MIL/uL (ref 3.87–5.11)
RDW: 13.7 % (ref 11.5–15.5)
WBC: 6.1 10*3/uL (ref 4.0–10.5)
nRBC: 0 % (ref 0.0–0.2)

## 2018-02-22 LAB — BASIC METABOLIC PANEL
Anion gap: 7 (ref 5–15)
BUN: 20 mg/dL (ref 6–20)
CO2: 28 mmol/L (ref 22–32)
Calcium: 9 mg/dL (ref 8.9–10.3)
Chloride: 101 mmol/L (ref 98–111)
Creatinine, Ser: 1.04 mg/dL — ABNORMAL HIGH (ref 0.44–1.00)
GFR calc Af Amer: >60 mL/min (ref >60)
GFR, EST NON AFRICAN AMERICAN: 59 mL/min — AB (ref >60)
Glucose, Bld: 111 mg/dL — ABNORMAL HIGH (ref 70–99)
Potassium: 3.8 mmol/L (ref 3.5–5.1)
Sodium: 136 mmol/L (ref 135–145)

## 2018-02-27 ENCOUNTER — Ambulatory Visit (HOSPITAL_COMMUNITY): Payer: BLUE CROSS/BLUE SHIELD | Admitting: Anesthesiology

## 2018-02-27 ENCOUNTER — Encounter (HOSPITAL_COMMUNITY)
Admission: RE | Disposition: A | Discharge status: Home / Self Care | Payer: Self-pay | Source: Home / Self Care | Attending: Internal Medicine

## 2018-02-27 ENCOUNTER — Encounter (HOSPITAL_COMMUNITY): Payer: Self-pay | Admitting: *Deleted

## 2018-02-27 ENCOUNTER — Ambulatory Visit (HOSPITAL_COMMUNITY)
Admission: RE | Admit: 2018-02-27 | Discharge: 2018-02-27 | Disposition: A | Discharge status: Home / Self Care | Payer: BLUE CROSS/BLUE SHIELD | Attending: Internal Medicine | Admitting: Internal Medicine

## 2018-02-27 DIAGNOSIS — Z87891 Personal history of nicotine dependence: Secondary | ICD-10-CM | POA: Insufficient documentation

## 2018-02-27 DIAGNOSIS — D123 Benign neoplasm of transverse colon: Secondary | ICD-10-CM | POA: Diagnosis not present

## 2018-02-27 DIAGNOSIS — K219 Gastro-esophageal reflux disease without esophagitis: Secondary | ICD-10-CM | POA: Insufficient documentation

## 2018-02-27 DIAGNOSIS — K621 Rectal polyp: Secondary | ICD-10-CM

## 2018-02-27 DIAGNOSIS — E7800 Pure hypercholesterolemia, unspecified: Secondary | ICD-10-CM | POA: Insufficient documentation

## 2018-02-27 DIAGNOSIS — Z79899 Other long term (current) drug therapy: Secondary | ICD-10-CM | POA: Insufficient documentation

## 2018-02-27 DIAGNOSIS — I10 Essential (primary) hypertension: Secondary | ICD-10-CM | POA: Diagnosis not present

## 2018-02-27 DIAGNOSIS — R195 Other fecal abnormalities: Secondary | ICD-10-CM | POA: Diagnosis present

## 2018-02-27 DIAGNOSIS — Q438 Other specified congenital malformations of intestine: Secondary | ICD-10-CM | POA: Diagnosis not present

## 2018-02-27 DIAGNOSIS — Z7951 Long term (current) use of inhaled steroids: Secondary | ICD-10-CM | POA: Insufficient documentation

## 2018-02-27 HISTORY — PX: POLYPECTOMY: SHX5525

## 2018-02-27 HISTORY — PX: COLONOSCOPY WITH PROPOFOL: SHX5780

## 2018-02-27 SURGERY — COLONOSCOPY WITH PROPOFOL
Anesthesia: Monitor Anesthesia Care

## 2018-02-27 MED ORDER — PROPOFOL 10 MG/ML IV BOLUS
INTRAVENOUS | Status: AC
  Filled 2018-02-27: qty 40

## 2018-02-27 MED ORDER — CHLORHEXIDINE GLUCONATE CLOTH 2 % EX PADS: 6.0000 | MEDICATED_PAD | Freq: Once | CUTANEOUS | Status: DC

## 2018-02-27 MED ORDER — PROPOFOL 10 MG/ML IV BOLUS
INTRAVENOUS | Status: DC | PRN
  Administered 2018-02-27 (×4): 15 mg via INTRAVENOUS

## 2018-02-27 MED ORDER — LIDOCAINE HCL URETHRAL/MUCOSAL 2 % EX GEL
CUTANEOUS | Status: AC
  Filled 2018-02-27: qty 30

## 2018-02-27 MED ORDER — LIDOCAINE HCL URETHRAL/MUCOSAL 2 % EX GEL
CUTANEOUS | Status: DC | PRN
  Administered 2018-02-27 (×2): 1

## 2018-02-27 MED ORDER — LACTATED RINGERS IV SOLN
INTRAVENOUS | Status: DC
  Administered 2018-02-27: 07:00:00 via INTRAVENOUS

## 2018-02-27 MED ORDER — PROPOFOL 500 MG/50ML IV EMUL
INTRAVENOUS | Status: DC | PRN
  Administered 2018-02-27 (×2): via INTRAVENOUS
  Administered 2018-02-27: 150 ug/kg/min via INTRAVENOUS

## 2018-02-27 MED ORDER — STERILE WATER FOR IRRIGATION IR SOLN
Status: DC | PRN
  Administered 2018-02-27: 100 mL

## 2018-02-27 NOTE — Anesthesia Postprocedure Evaluation (Signed)
Anesthesia Post Note  Patient: Deborah Walter  Procedure(s) Performed: COLONOSCOPY WITH PROPOFOL (N/A ) POLYPECTOMY  Patient location during evaluation: PACU Anesthesia Type: MAC Level of consciousness: awake and patient cooperative Pain management: pain level controlled Vital Signs Assessment: post-procedure vital signs reviewed and stable Respiratory status: spontaneous breathing, nonlabored ventilation and respiratory function stable Cardiovascular status: blood pressure returned to baseline Postop Assessment: no apparent nausea or vomiting Anesthetic complications: no     Last Vitals:  Vitals:   02/27/18 0717 02/27/18 0930  BP: 129/76 (!) 104/55  Pulse: 91 87  Resp: 18 16  Temp: 36.8 C (P) 36.6 C  SpO2: 99% 99%    Last Pain:  Vitals:   02/27/18 0923  TempSrc:   PainSc: 0-No pain                 , J

## 2018-02-27 NOTE — Discharge Instructions (Signed)
Colonoscopy Discharge Instructions  Read the instructions outlined below and refer to this sheet in the next few weeks. These discharge instructions provide you with general information on caring for yourself after you leave the hospital. Your doctor may also give you specific instructions. While your treatment has been planned according to the most current medical practices available, unavoidable complications occasionally occur. If you have any problems or questions after discharge, call Dr. Gala Romney at [phone]. ACTIVITY  You may resume your regular activity, but move at a slower pace for the next 24 hours.   Take frequent rest periods for the next 24 hours.   Walking will help get rid of the air and reduce the bloated feeling in your belly (abdomen).   No driving for 24 hours (because of the medicine (anesthesia) used during the test).    Do not sign any important legal documents or operate any machinery for 24 hours (because of the anesthesia used during the test).  NUTRITION  Drink plenty of fluids.   You may resume your normal diet as instructed by your doctor.   Begin with a light meal and progress to your normal diet. Heavy or fried foods are harder to digest and may make you feel sick to your stomach (nauseated).   Avoid alcoholic beverages for 24 hours or as instructed.  MEDICATIONS  You may resume your normal medications unless your doctor tells you otherwise.  WHAT YOU CAN EXPECT TODAY  Some feelings of bloating in the abdomen.   Passage of more gas than usual.   Spotting of blood in your stool or on the toilet paper.  IF YOU HAD POLYPS REMOVED DURING THE COLONOSCOPY:  No aspirin products for 7 days or as instructed.   No alcohol for 7 days or as instructed.   Eat a soft diet for the next 24 hours.  FINDING OUT THE RESULTS OF YOUR TEST Not all test results are available during your visit. If your test results are not back during the visit, make an appointment  with your caregiver to find out the results. Do not assume everything is normal if you have not heard from your caregiver or the medical facility. It is important for you to follow up on all of your test results.  SEEK IMMEDIATE MEDICAL ATTENTION IF:  You have more than a spotting of blood in your stool.   Your belly is swollen (abdominal distention).   You are nauseated or vomiting.   You have a temperature over 101.   You have abdominal pain or discomfort that is severe or gets worse throughout the day.   Colon Polyps  Polyps are tissue growths inside the body. Polyps can grow in many places, including the large intestine (colon). A polyp may be a round bump or a mushroom-shaped growth. You could have one polyp or several. Most colon polyps are noncancerous (benign). However, some colon polyps can become cancerous over time. Finding and removing the polyps early can help prevent this. What are the causes? The exact cause of colon polyps is not known. What increases the risk? You are more likely to develop this condition if you:  Have a family history of colon cancer or colon polyps.  Are older than 35 or older than 45 if you are African American.  Have inflammatory bowel disease, such as ulcerative colitis or Crohn's disease.  Have certain hereditary conditions, such as: ? Familial adenomatous polyposis. ? Lynch syndrome. ? Turcot syndrome. ? Peutz-Jeghers syndrome.  Are overweight.  Smoke cigarettes.  Do not get enough exercise.  Drink too much alcohol.  Eat a diet that is high in fat and red meat and low in fiber.  Had childhood cancer that was treated with abdominal radiation. What are the signs or symptoms? Most polyps do not cause symptoms. If you have symptoms, they may include:  Blood coming from your rectum when having a bowel movement.  Blood in your stool. The stool may look dark red or black.  Abdominal pain.  A change in bowel habits, such as  constipation or diarrhea. How is this diagnosed? This condition is diagnosed with a colonoscopy. This is a procedure in which a lighted, flexible scope is inserted into the anus and then passed into the colon to examine the area. Polyps are sometimes found when a colonoscopy is done as part of routine cancer screening tests. How is this treated? Treatment for this condition involves removing any polyps that are found. Most polyps can be removed during a colonoscopy. Those polyps will then be tested for cancer. Additional treatment may be needed depending on the results of testing. Follow these instructions at home: Lifestyle  Maintain a healthy weight, or lose weight if recommended by your health care provider.  Exercise every day or as told by your health care provider.  Do not use any products that contain nicotine or tobacco, such as cigarettes and e-cigarettes. If you need help quitting, ask your health care provider.  If you drink alcohol, limit how much you have: ? 0-1 drink a day for women. ? 0-2 drinks a day for men.  Be aware of how much alcohol is in your drink. In the [place] one drink equals one 12 oz bottle of beer (355 mL), one 5 oz glass of wine (148 mL), or one 1 oz shot of hard liquor (44 mL). Eating and drinking   Eat foods that are high in fiber, such as fruits, vegetables, and whole grains.  Eat foods that are high in calcium and vitamin D, such as milk, cheese, yogurt, eggs, liver, fish, and broccoli.  Limit foods that are high in fat, such as fried foods and desserts.  Limit the amount of red meat and processed meat you eat, such as hot dogs, sausage, bacon, and lunch meats. General instructions  Keep all follow-up visits as told by your health care provider. This is important. ? This includes having regularly scheduled colonoscopies. ? Talk to your health care provider about when you need a colonoscopy. Contact a health care provider if:  You have new or  worsening bleeding during a bowel movement.  You have new or increased blood in your stool.  You have a change in bowel habits.  You lose weight for no known reason. Summary  Polyps are tissue growths inside the body. Polyps can grow in many places, including the colon.  Most colon polyps are noncancerous (benign), but some can become cancerous over time.  This condition is diagnosed with a colonoscopy.  Treatment for this condition involves removing any polyps that are found. Most polyps can be removed during a colonoscopy. This information is not intended to replace advice given to you by your health care provider. Make sure you discuss any questions you have with your health care provider. Document Released: 10/08/2003 Document Revised: 04/28/2017 Document Reviewed: 04/28/2017 Elsevier Interactive Patient Education  2019 [place] information provided  Further recommendations to follow pending review of pathology report

## 2018-02-27 NOTE — Transfer of Care (Signed)
Immediate Anesthesia Transfer of Care Note  Patient: Deborah Walter  Procedure(s) Performed: COLONOSCOPY WITH PROPOFOL (N/A ) POLYPECTOMY  Patient Location: PACU  Anesthesia Type:MAC  Level of Consciousness: drowsy and patient cooperative  Airway & Oxygen Therapy: Patient Spontanous Breathing and Patient connected to nasal cannula oxygen  Post-op Assessment: Report given to RN, Post -op Vital signs reviewed and stable and Patient moving all extremities  Post vital signs: Reviewed and stable  Last Vitals:  Vitals Value Taken Time  BP    Temp    Pulse 33 02/27/2018  9:28 AM  Resp    SpO2 82 % 02/27/2018  9:28 AM  Vitals shown include unvalidated device data.  Last Pain:  Vitals:   02/27/18 0923  TempSrc:   PainSc: 0-No pain      Patients Stated Pain Goal: 5 (02/40/97 3532)  Complications: No apparent anesthesia complications

## 2018-02-27 NOTE — Interval H&P Note (Signed)
History and Physical Interval Note:  02/27/2018 7:27 AM  Deborah Walter  has presented today for surgery, with the diagnosis of +cologuard  The various methods of treatment have been discussed with the patient and family. After consideration of risks, benefits and other options for treatment, the patient has consented to  Procedure(s) with comments: COLONOSCOPY WITH PROPOFOL (N/A) - 8:30am as a surgical intervention .  The patient's history has been reviewed, patient examined, no change in status, stable for surgery.  I have reviewed the patient's chart and labs.  Questions were answered to the patient's satisfaction.        No change.  Positive fecal DNA testing.  Here for diagnostic colonoscopy.  The risks, benefits, limitations, alternatives and imponderables have been reviewed with the patient. Questions have been answered. All parties are agreeable.

## 2018-02-27 NOTE — Op Note (Signed)
[facility] Patient Name: Deborah Walter Procedure Date: 02/27/2018 8:14 AM MRN: [id number] Date of Birth: January 03, 1961 Attending MD: Norvel Richards , MD CSN: [id number] Age: 58 Admit Type: Outpatient Procedure:                Colonoscopy Indications:              Positive Cologuard test Providers:                Norvel Richards, MD, Rosina Lowenstein, RN, Aram Candela Referring MD:              Medicines:                Propofol per Anesthesia Complications:            No immediate complications. Estimated Blood Loss:     Estimated blood loss was minimal. Procedure:                Pre-Anesthesia Assessment:                           - Prior to the procedure, a History and Physical                            was performed, and patient medications and                            allergies were reviewed. The patient's tolerance of                            previous anesthesia was also reviewed. The risks                            and benefits of the procedure and the sedation                            options and risks were discussed with the patient.                            All questions were answered, and informed consent                            was obtained. Prior Anticoagulants: The patient has                            taken no previous anticoagulant or antiplatelet                            agents. ASA Grade Assessment: II - A patient with                            mild systemic disease. After reviewing the risks  and benefits, the patient was deemed in                            satisfactory condition to undergo the procedure.                           After obtaining informed consent, the colonoscope                            was passed under direct vision. Throughout the                            procedure, the patient's blood pressure, pulse, and                            oxygen saturations were monitored  continuously. The                            [id number]-[id number] ([id number]) scope was introduced through                            the and advanced to the the cecum, identified by                            appendiceal orifice and ileocecal valve. The                            colonoscopy was performed without difficulty. The                            patient tolerated the procedure well. The quality                            of the bowel preparation was adequate. The                            ileocecal valve, appendiceal orifice, and rectum                            were photographed. The colonoscopy was performed                            without difficulty. The entire colon was well                            visualized. Scope In: 8:51:23 AM Scope Out: 9:20:56 AM Scope Withdrawal Time: 0 hours 14 minutes 34 seconds  Total Procedure Duration: 0 hours 29 minutes 33 seconds  Findings:      The perianal and digital rectal examinations were normal. Redundant       colon. External abdominal pressure and changing of the patient's       position required to reach the cecum.      Two sessile polyps were found in the rectum and hepatic flexure. The       polyps were  4 to 8 mm in size. These polyps were removed with a cold       snare. Resection and retrieval were complete. Estimated blood loss was       minimal.      The exam was otherwise without abnormality on direct and retroflexion       views. Small rectal polyp was not treatment retrieved at the time of       this dictation. Impression:               Redundant colon.- Two 4 to 8 mm polyps in the                            rectum and at the hepatic flexure, removed with a                            cold snare. Resected and retrieved.                           - The examination was otherwise normal on direct                            and retroflexion views. Redundant colon. Moderate Sedation:      Moderate (conscious) sedation was  personally administered by an       anesthesia professional. The following parameters were monitored: oxygen       saturation, heart rate, blood pressure, respiratory rate, EKG, adequacy       of pulmonary ventilation, and response to care. Recommendation:           - Patient has a contact number available for                            emergencies. The signs and symptoms of potential                            delayed complications were discussed with the                            patient. Return to normal activities tomorrow.                            Written discharge instructions were provided to the                            patient.                           - Advance diet as tolerated.                           - Continue present medications.                           - Repeat colonoscopy date to be determined after                            pending pathology results  are reviewed for                            surveillance based on pathology results.                           - Return to GI office (date not yet determined). Procedure Code(s):        --- Professional ---                           [phone], Colonoscopy, flexible; with removal of                            tumor(s), polyp(s), or other lesion(s) by snare                            technique Diagnosis Code(s):        --- Professional ---                           K62.1, Rectal polyp                           D12.3, Benign neoplasm of transverse colon (hepatic                            flexure or splenic flexure)                           R19.5, Other fecal abnormalities CPT copyright 2018 American Medical Association. All rights reserved. The codes documented in this report are preliminary and upon coder review may  be revised to meet current compliance requirements. Cristopher Estimable. , MD Norvel Richards, MD 02/27/2018 9:29:36 AM This report has been signed electronically. Number of Addenda: 0

## 2018-02-27 NOTE — Anesthesia Preprocedure Evaluation (Signed)
Anesthesia Evaluation  Patient identified by MRN, date of birth, ID band Patient awake    Reviewed: Allergy & Precautions, H&P , NPO status , Patient's Chart, lab work & pertinent test results  Airway Mallampati: II  TM Distance: >3 FB Neck ROM: full    Dental no notable dental hx.    Pulmonary shortness of breath, former smoker,    Pulmonary exam normal breath sounds clear to auscultation       Cardiovascular Exercise Tolerance: Good hypertension, negative cardio ROS   Rhythm:regular Rate:Normal     Neuro/Psych negative neurological ROS  negative psych ROS   GI/Hepatic Neg liver ROS, GERD  ,  Endo/Other  negative endocrine ROS  Renal/GU negative Renal ROS  negative genitourinary   Musculoskeletal   Abdominal   Peds  Hematology negative hematology ROS (+)   Anesthesia Other Findings   Reproductive/Obstetrics negative OB ROS                             Anesthesia Physical Anesthesia Plan  ASA: II  Anesthesia Plan: MAC   Post-op Pain Management:    Induction:   PONV Risk Score and Plan:   Airway Management Planned:   Additional Equipment:   Intra-op Plan:   Post-operative Plan:   Informed Consent: I have reviewed the patients History and Physical, chart, labs and discussed the procedure including the risks, benefits and alternatives for the proposed anesthesia with the patient or authorized representative who has indicated his/her understanding and acceptance.     Dental Advisory Given  Plan Discussed with: CRNA  Anesthesia Plan Comments:         Anesthesia Quick Evaluation

## 2018-02-28 ENCOUNTER — Encounter: Payer: Self-pay | Admitting: Internal Medicine

## 2018-03-02 ENCOUNTER — Ambulatory Visit: Payer: BLUE CROSS/BLUE SHIELD | Admitting: Allergy & Immunology

## 2018-03-02 ENCOUNTER — Encounter: Payer: Self-pay | Admitting: Allergy & Immunology

## 2018-03-02 VITALS — BP 130/84 | HR 80 | Resp 16

## 2018-03-02 DIAGNOSIS — R0602 Shortness of breath: Secondary | ICD-10-CM

## 2018-03-02 DIAGNOSIS — J3089 Other allergic rhinitis: Secondary | ICD-10-CM | POA: Diagnosis not present

## 2018-03-02 DIAGNOSIS — L299 Pruritus, unspecified: Secondary | ICD-10-CM | POA: Diagnosis not present

## 2018-03-02 DIAGNOSIS — J302 Other seasonal allergic rhinitis: Secondary | ICD-10-CM | POA: Diagnosis not present

## 2018-03-02 MED ORDER — FLUTICASONE PROPIONATE 50 MCG/ACT NA SUSP: 1.0000 | Freq: Two times a day (BID) | NASAL | 5 refills | Status: DC

## 2018-03-02 NOTE — Patient Instructions (Addendum)
1. Seasonal and perennial allergic rhinitis (ragweed, [place] grass, molds, dust mite) - Continue with fluticasone one spray per nostril twice daily.  - We will send in a prescription for this.   2. Pruritus - Continue with hydroxyzine as needed at night.  3. Shortness of breath - It seems that the Qvar is working well. - We will make this an as-needed. - Daily controller medication(s): NONE - Prior to physical activity: ProAir 2 puffs 10-15 minutes before physical activity. - Rescue medications: ProAir 4 puffs every 4-6 hours as needed - Changes during respiratory infections or worsening symptoms: Add on Qvar 80 mcg 2 puffs twice daily for TWO WEEKS. - Asthma control goals:  * Full participation in all desired activities (may need albuterol before activity) * Albuterol use two time or less a week on average (not counting use with activity) * Cough interfering with sleep two time or less a month * Oral steroids no more than once a year * No hospitalizations  4. Return in about 6 months (around 08/31/2018).   Please inform us of any Emergency Department visits, hospitalizations, or changes in symptoms. Call us before going to the ED for breathing or allergy symptoms since we might be able to fit you in for a sick visit. Feel free to contact us anytime with any questions, problems, or concerns.  It was a pleasure to see you again today!  Websites that have reliable patient information: 1. American Academy of Asthma, Allergy, and Immunology: www.aaaai.org 2. Food Allergy Research and Education (FARE): foodallergy.org 3. Mothers of Asthmatics: http://www.asthmacommunitynetwork.org 4. American College of Allergy, Asthma, and Immunology: MonthlyElectricBill.co.uk   Make sure you are registered to vote! If you have moved or changed any of your contact information, you will need to get this updated before voting!    Voter ID laws are POSSIBLY going into effect for the General Election in November  2020! Be prepared! Check out http://levine.[email or web address] for more details.

## 2018-03-02 NOTE — Progress Notes (Signed)
FOLLOW UP  Date of Service/Encounter:  03/02/18   Assessment:   Seasonal and perennial allergic rhinitis  Pruritus  Shortness of breath - ? asthma   Ms. Mottley presents for follow-up visit.  She is doing very well.  She did start the Qvar at the last visit 2 puffs twice daily and continued this for a matter of weeks.  However, over time, she just forgot to take it and did not feel any differently.  Therefore, she has remained off of this.  Her itching continues to be controlled with the use of hydroxyzine as needed at night.  While we have tried a variety of other antihistamines, she seems to have a bad reaction to the majority of them.  Her allergic rhinitis is controlled with fluticasone 1 spray per nostril twice daily.  We spent a lot of her visit discussing her current employment situation.  She tells me that she has been working for the same person for several years, almost 10 years, and has not received a raise during that time.  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  I recommended that she realized her worth and not sell herself short.  She is going to look into some other job options.  She is also looking into getting a real estate license.  Plan/Recommendations:   1. Seasonal and perennial allergic rhinitis (ragweed, [place] grass, molds, dust mite) - Continue with fluticasone one spray per nostril twice daily.  - We will send in a prescription for this.   2. Pruritus - Continue with hydroxyzine as needed at night.  3. Shortness of breath - It seems that the Qvar is working well. - We will make this an as-needed. - Daily controller medication(s): NONE - Prior to physical activity: ProAir 2 puffs 10-15 minutes before physical activity. - Rescue medications: ProAir 4 puffs every 4-6 hours as needed - Changes during respiratory infections or worsening symptoms: Add on Qvar 80 mcg 2 puffs twice daily for TWO WEEKS. -  Asthma control goals:  * Full participation in all desired activities (may need albuterol before activity) * Albuterol use two time or less a week on average (not counting use with activity) * Cough interfering with sleep two time or less a month * Oral steroids no more than once a year * No hospitalizations  4. Return in about 6 months (around 08/31/2018).  Subjective:   Demira Gwynne is a 58 y.o. female presenting today for follow up of  Chief Complaint  Patient presents with  . Allergic Rhinitis     follow up doing good     Trang Bouse has a history of the following: Patient Active Problem List   Diagnosis Date Noted  . Positive colorectal cancer screening using Cologuard test 02/03/2018  . Shortness of breath 02/02/2018  . Other allergic rhinitis 12/07/2017  . Pruritus 12/07/2017  . Breast calcification, right 10/10/2017    History obtained from: chart review and patient.  Quinn Plowman Primary Care Provider is Sandi Mealy, MD.     Skye is a 58 y.o. female presenting for a follow up visit.  She was last seen in January 2020.  At that time, she was continued on fluticasone 1 spray per nostril twice daily.  She has been on Nasacort and Singulair in the past which were either too expensive or caused itching.  She had testing in 2017 that was positive to ragweed, [place] grass, molds, dust mite.  For her itching, she had a CBC and CMP that was normal in April 2019.  This was improved with hydroxyzine, so she was continued on hydroxyzine 10 mg at night as needed.  Her shortness of breath was treated with albuterol twice daily.  She was also started on Qvar 80 mcg 2 puffs twice daily due to slight abnormalities of the flow volume loop as well as increased symptoms.  She remains on albuterol 2 puffs every 4 hours as needed.  Since the last visit, she reports that she has done well.  She did remain on the Qvar for 2 to 3 weeks with improvement in her symptoms.  However, she  eventually stopped taking it and felt no difference so she remained off of it.  ACT score today is 22, indicating excellent asthma control.  She has not needed to go to the urgent care or emergency room for her symptoms.  She denies nighttime coughing or wheezing.  Her allergic rhinitis has been fairly well controlled.  She does like the fluticasone, but is requesting a prescription for this.  She does not use her antihistamine on a regular basis.  She has not needed antibiotics in quite some time.  Her pruritus is controlled somewhat with the hydroxyzine as needed at night.  She will occasionally need another medication, but she does not respond so well to multiple antihistamines, that she typically just lives with it.  Otherwise, there have been no changes to her past medical history, surgical history, family history, or social history.    Review of Systems: a 14-point review of systems is pertinent for what is mentioned in HPI.  Otherwise, all other systems were negative.  Constitutional: negative other than that listed in the HPI Eyes: negative other than that listed in the HPI Ears, nose, mouth, throat, and face: negative other than that listed in the HPI Respiratory: negative other than that listed in the HPI Cardiovascular: negative other than that listed in the HPI Gastrointestinal: negative other than that listed in the HPI Genitourinary: negative other than that listed in the HPI Integument: negative other than that listed in the HPI Hematologic: negative other than that listed in the HPI Musculoskeletal: negative other than that listed in the HPI Neurological: negative other than that listed in the HPI Allergy/Immunologic: negative other than that listed in the HPI    Objective:   Blood pressure 130/84, pulse 80, resp. rate 16. There is no height or weight on file to calculate BMI.   Physical Exam:  General: Alert, interactive, in no acute distress.  Pleasant.   Talkative. Eyes: No conjunctival injection bilaterally, no discharge on the right, no discharge on the left and no Horner-Trantas dots present. PERRL bilaterally. EOMI without pain. No photophobia.  Ears: Right TM pearly gray with normal light reflex, Left TM pearly gray with normal light reflex, Right TM intact without perforation and Left TM intact without perforation.  Nose/Throat: External nose within normal limits and septum midline. Turbinates edematous and pale without discharge. Posterior oropharynx mildly erythematous without cobblestoning in the posterior oropharynx. Tonsils 2+ without exudates.  Tongue without thrush. Lungs: Clear to auscultation without wheezing, rhonchi or rales. No increased work of breathing. CV: Normal S1/S2. No murmurs. Capillary refill <2 seconds.  Skin: Warm and dry, without lesions or rashes. Neuro:   Grossly intact. No focal deficits appreciated. Responsive to questions.  Diagnostic studies: none     Salvatore Marvel, MD  [facility]

## 2018-03-03 ENCOUNTER — Encounter (HOSPITAL_COMMUNITY): Payer: Self-pay | Admitting: Internal Medicine

## 2018-03-31 ENCOUNTER — Telehealth: Payer: Self-pay | Admitting: Allergy & Immunology

## 2018-03-31 MED ORDER — BECLOMETHASONE DIPROPIONATE 80 MCG/ACT IN AERS: 2.0000 | INHALATION_SPRAY | Freq: Every day | RESPIRATORY_TRACT | 5 refills | Status: DC | PRN

## 2018-03-31 NOTE — Telephone Encounter (Signed)
Pt called and needs to have Qvar called into [facility] in [place]. [phone]

## 2018-03-31 NOTE — Telephone Encounter (Signed)
Refill sent to requested pharmacy.

## 2018-04-17 ENCOUNTER — Telehealth: Payer: Self-pay | Admitting: Allergy

## 2018-04-17 MED ORDER — BUDESONIDE-FORMOTEROL FUMARATE 80-4.5 MCG/ACT IN AERO: 2.0000 | INHALATION_SPRAY | Freq: Two times a day (BID) | RESPIRATORY_TRACT | 5 refills | Status: DC

## 2018-04-17 NOTE — Telephone Encounter (Signed)
Patient states she is taking her Qvar 82 as prescribed and states she is not taking antihistamines due to it causing her flare ups on her breathing issues. Patient was advise to do the nasal saline rinse but states she cannot do the nasal saline rinse due to it causes her to gag. Advise patient if she is having breathing issues to take her albuterol inhaler 2 puffs PRN. She is unsure on what to do since she is has had sinus surgery and is having flare ups. Patient does do her fluticasone spray as prescribed. Dr. Maudie Mercury please advise... Patient also changes her home air filters monthly

## 2018-04-17 NOTE — Telephone Encounter (Signed)
Patient is calling stating that she feels fine - she is ok She is mentioned to her doctor that she has SOB and Chest Tightness when going outside  Patient feels that it could be the increase in pollen and her allergies What can she do?? Please call

## 2018-04-17 NOTE — Telephone Encounter (Signed)
Symbicort 80 mcg sent in to [facility], and patient will try that for several weeks, patient has no sx of fever or chills.

## 2018-04-17 NOTE — Telephone Encounter (Signed)
Please call back patient.  If she is having SOB and chest tightness when outdoors only, then we could send in a stronger inhaler such as Ruthe Mannan, Symbicort, Advair or Breo depending on what her insurance covers. This would replace the Qvar. We can try this for next few weeks and see how her symptoms are doing. Let me know if she wants to give it a try and we can send in rx.  Is she having any fevers or chills?

## 2018-05-01 ENCOUNTER — Other Ambulatory Visit: Payer: Self-pay | Admitting: Allergy

## 2018-05-04 ENCOUNTER — Telehealth: Payer: Self-pay | Admitting: Allergy

## 2018-05-04 MED ORDER — AEROCHAMBER PLUS FLO-VU MISC: 1.0000 | 2 refills | Status: DC | PRN

## 2018-05-04 NOTE — Telephone Encounter (Signed)
Patient has cough and some shortness of breath. Is coughing up mucus.

## 2018-05-04 NOTE — Telephone Encounter (Signed)
Woke up this morning coughing like crazy.  Pt states that when she comes outside, she can hardly breathe.  No fever, no body aches, no chills.  Used her albuterol this morning, but did not use her Symbicort.  Unable to walk from her bedroom to the kitchen without being SOB.  Pt states that she had her medication confused.  Not using a spacer, but would like one because it felt like medication was stuck in the roof of her mouth. Explained the difference between her albuterol and Symbicort and the reason we prescribe it, and the importance of taking it everyday without missing any doses.  Pt states that she will try the medication with the spacer and will let us know in a couple of days.  Does not feel the need to come in.  Pt verbalized understanding of instructions and will try the spacer.  Also, instructed patient that if she did not feel any better and felt the need to seek medical attention please do so.

## 2018-05-17 ENCOUNTER — Telehealth: Payer: Self-pay

## 2018-05-17 MED ORDER — NYSTATIN 100000 UNIT/ML MT SUSP: 5.0000 mL | Freq: Four times a day (QID) | OROMUCOSAL | 0 refills | Status: AC

## 2018-05-17 NOTE — Telephone Encounter (Signed)
Patient called and stated that since using her Symbicort she has developed a rash on her tongue and has last taste. I asked patient if she was rinsing her mouth out after and she stated that she wasn't. I informed patient that it would be best to rinse her mouth out after, she then informed me that for the last week she has started using her Symbicort then brushing her teeth after but that has not helped. Per Dr. Ernst Bowler we are sending in Nystatin suspension for her to use 3-4 times a day for a week. Rx sent in and patient made aware.

## 2018-05-18 ENCOUNTER — Other Ambulatory Visit: Payer: Self-pay | Admitting: Allergy

## 2018-07-04 ENCOUNTER — Encounter: Payer: Self-pay | Admitting: Allergy & Immunology

## 2018-07-04 ENCOUNTER — Ambulatory Visit: Payer: BLUE CROSS/BLUE SHIELD | Admitting: Allergy & Immunology

## 2018-07-04 ENCOUNTER — Other Ambulatory Visit: Payer: Self-pay

## 2018-07-04 VITALS — BP 122/60 | HR 111 | Temp 98.9°F | Resp 16 | Ht 68.0 in | Wt 234.6 lb

## 2018-07-04 DIAGNOSIS — R0602 Shortness of breath: Secondary | ICD-10-CM

## 2018-07-04 DIAGNOSIS — R221 Localized swelling, mass and lump, neck: Secondary | ICD-10-CM | POA: Diagnosis not present

## 2018-07-04 DIAGNOSIS — J302 Other seasonal allergic rhinitis: Secondary | ICD-10-CM

## 2018-07-04 DIAGNOSIS — J3089 Other allergic rhinitis: Secondary | ICD-10-CM | POA: Diagnosis not present

## 2018-07-04 DIAGNOSIS — L299 Pruritus, unspecified: Secondary | ICD-10-CM

## 2018-07-04 MED ORDER — CLINDAMYCIN HCL 300 MG PO CAPS: 300.0000 mg | ORAL_CAPSULE | Freq: Three times a day (TID) | ORAL | 0 refills | Status: AC

## 2018-07-04 NOTE — Progress Notes (Signed)
FOLLOW UP  Date of Service/Encounter:  07/04/18   Assessment:   Neck swelling - ? bacterial etiology  Pruritus  Seasonal and perennial allergic rhinitis  Shortness of breath  Plan/Recommendations:   1. Neck swelling - Start clindamycin 300 mg 3 times daily for 1 week. - This can cause increased diarrhea, so continue with your yogurt. - Clindamycin has good anaerobic coverage, similar to Augmentin. - However, she has a history of a penicillin allergy.  - Call us on Thursday or Friday with an update.  - We may need to get a neck ultrasound to evaluate this further.  2. Seasonal and perennial allergic rhinitis (ragweed, [place] grass, molds, dust mite) - Continue with fluticasone one spray per nostril twice daily.   3. Pruritus - Continue with hydroxyzine as needed at night.  4. Shortness of breath - I am still confused about the diagnosis of asthma. - We will consider getting a methacholine challenge in the future to definitively rule in or out asthma. - In the meantime, continue with Symbicort 1 to 2 puffs twice daily. - We will make this an as-needed. - Daily controller medication(s): Symbicort 160/4.5 1-2 puffs twice daily - Prior to physical activity: ProAir 2 puffs 10-15 minutes before physical activity. - Rescue medications: ProAir 4 puffs every 4-6 hours as needed - Changes during respiratory infections or worsening symptoms: Add on Qvar 80 mcg 2 puffs twice daily for TWO WEEKS. - Asthma control goals:  * Full participation in all desired activities (may need albuterol before activity) * Albuterol use two time or less a week on average (not counting use with activity) * Cough interfering with sleep two time or less a month * Oral steroids no more than once a year * No hospitalizations  5. Return in about 3 months (around 10/04/2018). This can be an in-person, a virtual Webex or a telephone follow up visit.   Subjective:   Melanye Hiraldo is a 58 y.o. female  presenting today for follow up of  Chief Complaint  Patient presents with  . Asthma  . Allergic Rhinitis   . Cough    mucous in throat  . Angioedema    at her throat    Renesmae Donahey has a history of the following: Patient Active Problem List   Diagnosis Date Noted  . Positive colorectal cancer screening using Cologuard test 02/03/2018  . Shortness of breath 02/02/2018  . Other allergic rhinitis 12/07/2017  . Pruritus 12/07/2017  . Breast calcification, right 10/10/2017    History obtained from: chart review and patient.  Hosanna is a 58 y.o. female presenting for a sick visit.  She was last seen in February 2020.  At that time, her allergic rhinitis was controlled with Flonase twice daily.  Her pruritus was controlled with hydroxyzine as needed.  For her shortness of breath, we made her Qvar into a as needed medication for use when she had respiratory symptoms.  In the interim, she was changed to Symbicort from the [place] because she was needing her albuterol very frequently She did this change in March 2020.   Merla presents today for a sick visit. She reports that she has had hazel drip and a sensation of something being caught in her throat for a few days. She has had no fevers with this. In conjunction with [place], she reports a swelling in the midline of her neck. This was first noted around Saturday. It is not very painful unless she plays with it. Again,  she denies fevers with this. She has no history of thyroid conditions. She has not had antibiotics and she had sinus surgery around three years ago.  She continues to have shortness of breath that is on and off. She does have the Symbicort, where she is is to pass twice daily. This is not a regular medication for her, however. She has been using it the past few days. She isn't sure if it helps. She's never undergone a methacholine challenge and is slightly interested in this today, although she would rather try other changes first. She  has not received prednisone for her symptoms at all.  She is going to undergo a right shoulder surgery soon. She is seeing an orthopedic surgeon later this week.  She didn't get a 10% raise at work, which is not a lot, but she is thankful for it. Still, she enjoys her work and would not want to work anywhere else. Otherwise, there have been no changes to her past medical history, surgical history, family history, or social history.    Review of Systems  Constitutional: Negative.  Negative for chills, fever, malaise/fatigue and weight loss.  HENT: Positive for sore throat. Negative for congestion, ear discharge, ear pain and sinus pain.        Positive for postnasal drip.  Eyes: Negative for pain, discharge and redness.  Respiratory: Negative for cough, sputum production, shortness of breath and wheezing.   Cardiovascular: Negative.  Negative for chest pain and palpitations.  Gastrointestinal: Negative for abdominal pain, heartburn, nausea and vomiting.  Skin: Negative.  Negative for itching and rash.  Neurological: Negative for dizziness and headaches.  Endo/Heme/Allergies: Negative for environmental allergies. Does not bruise/bleed easily.       Objective:   Blood pressure 122/60, pulse (!) 111, temperature 98.9 F (37.2 C), temperature source Temporal, resp. rate 16, height 5\' 8"  (1.727 m), weight 234 lb 9.6 oz (106.4 kg), SpO2 97 %. Body mass index is 35.67 kg/m.   Physical Exam:  Physical Exam  Constitutional: She appears well-developed.  Pleasant female.  Very personable.  HENT:  Head: Normocephalic and atraumatic.  Right Ear: Tympanic membrane, external ear and ear canal normal.  Left Ear: Tympanic membrane, external ear and ear canal normal.  Nose: Mucosal edema and rhinorrhea present. No nasal deformity or septal deviation. No epistaxis. Right sinus exhibits no maxillary sinus tenderness and no frontal sinus tenderness. Left sinus exhibits no maxillary sinus tenderness  and no frontal sinus tenderness.  Mouth/Throat: Uvula is midline and oropharynx is clear and moist. Mucous membranes are not pale and not dry.  She does have some cobblestoning in the posterior oropharynx.  She also has a soft mobile swollen area in the midline neck near the thorax that is approximately 2 cm in diameter.  There is no pain to palpation.  It is erythematous over the swelling and somewhat fluctuant.  Eyes: Pupils are equal, round, and reactive to light. Conjunctivae and EOM are normal. Right eye exhibits no chemosis and no discharge. Left eye exhibits no chemosis and no discharge. Right conjunctiva is not injected. Left conjunctiva is not injected.  Cardiovascular: Normal rate, regular rhythm and normal heart sounds.  Respiratory: Effort normal and breath sounds normal. No accessory muscle usage. No tachypnea. No respiratory distress. She has no wheezes. She has no rhonchi. She has no rales. She exhibits no tenderness.  Lymphadenopathy:    She has no cervical adenopathy.  Neurological: She is alert.  Skin: No abrasion, no petechiae and  no rash noted. Rash is not papular, not vesicular and not urticarial. No erythema. No pallor.  Psychiatric: She has a normal mood and affect.     Diagnostic studies: none     Salvatore Marvel, MD  [facility]

## 2018-07-04 NOTE — Patient Instructions (Addendum)
1. Neck swelling - Start clindamycin 300 mg 3 times daily for 1 week. - This can cause increased diarrhea, so continue with your yogurt. - Call us on Thursday or Friday with an update.  - We may need to get a neck ultrasound to evaluate this further.  2. Seasonal and perennial allergic rhinitis (ragweed, [place] grass, molds, dust mite) - Continue with fluticasone one spray per nostril twice daily.   3. Pruritus - Continue with hydroxyzine as needed at night.  4. Shortness of breath - I am still confused about the diagnosis of asthma. - We will consider getting a methacholine challenge in the future to definitively rule in or out asthma. - In the meantime, continue with Symbicort 1 to 2 puffs twice daily. - We will make this an as-needed. - Daily controller medication(s): Symbicort 160/4.5 1-2 puffs twice daily - Prior to physical activity: ProAir 2 puffs 10-15 minutes before physical activity. - Rescue medications: ProAir 4 puffs every 4-6 hours as needed - Changes during respiratory infections or worsening symptoms: Add on Qvar 80 mcg 2 puffs twice daily for TWO WEEKS. - Asthma control goals:  * Full participation in all desired activities (may need albuterol before activity) * Albuterol use two time or less a week on average (not counting use with activity) * Cough interfering with sleep two time or less a month * Oral steroids no more than once a year * No hospitalizations  5. Return in about 3 months (around 10/04/2018). This can be an in-person, a virtual Webex or a telephone follow up visit.   Please inform us of any Emergency Department visits, hospitalizations, or changes in symptoms. Call us before going to the ED for breathing or allergy symptoms since we might be able to fit you in for a sick visit. Feel free to contact us anytime with any questions, problems, or concerns.  It was a pleasure to see you again today!  Websites that have reliable patient information: 1.  American Academy of Asthma, Allergy, and Immunology: www.aaaai.org 2. Food Allergy Research and Education (FARE): foodallergy.org 3. Mothers of Asthmatics: http://www.asthmacommunitynetwork.org 4. American College of Allergy, Asthma, and Immunology: www.acaai.org  "Like" [place] on Facebook and Instagram for our latest updates!      Make sure you are registered to vote! If you have moved or changed any of your contact information, you will need to get this updated before voting!    Voter ID laws are NOT going into effect for the General Election in November 2020! DO NOT let this stop you from exercising your right to vote!   Absentee voting is the SAFEST way to vote during the coronavirus pandemic! Download and print an absentee ballot request form at [email or web address]://[email or web address]  More information on absentee ballots can be found here: [email or web address]

## 2018-07-07 ENCOUNTER — Telehealth: Payer: Self-pay | Admitting: Allergy & Immunology

## 2018-07-07 NOTE — Telephone Encounter (Signed)
Patient was seen on Tuesday, 07-04-18. She said at the time she had a swollen spot on there throat. She said it is still swollen and it is more sore.

## 2018-07-07 NOTE — Telephone Encounter (Signed)
Please advise 

## 2018-07-07 NOTE — Telephone Encounter (Signed)
Call back to patient, swelling has gone done and her throat is sore.  Hurts when she touches it.  Feels smaller today. Using the antibiotics as prescribed.  Just calling back to give an update.  No further needs from patient.

## 2018-07-11 ENCOUNTER — Other Ambulatory Visit: Payer: Self-pay | Admitting: Allergy & Immunology

## 2018-07-12 NOTE — Telephone Encounter (Signed)
Awesome!  That is good to know.  I appreciate her give me an update.  Salvatore Marvel, MD [facility]

## 2018-07-12 NOTE — Telephone Encounter (Signed)
Patient finished the antibiotic today and wanted to let Dr Ernst Bowler know the swelling is about 98-99% better

## 2018-07-19 ENCOUNTER — Ambulatory Visit: Payer: BC Managed Care – PPO | Admitting: Allergy & Immunology

## 2018-07-19 ENCOUNTER — Other Ambulatory Visit: Payer: Self-pay

## 2018-07-19 ENCOUNTER — Encounter: Payer: Self-pay | Admitting: Allergy & Immunology

## 2018-07-19 VITALS — BP 110/74 | HR 83 | Temp 97.9°F

## 2018-07-19 DIAGNOSIS — R221 Localized swelling, mass and lump, neck: Secondary | ICD-10-CM

## 2018-07-19 DIAGNOSIS — R0602 Shortness of breath: Secondary | ICD-10-CM

## 2018-07-19 DIAGNOSIS — J3089 Other allergic rhinitis: Secondary | ICD-10-CM

## 2018-07-19 DIAGNOSIS — J302 Other seasonal allergic rhinitis: Secondary | ICD-10-CM | POA: Diagnosis not present

## 2018-07-19 NOTE — Progress Notes (Signed)
FOLLOW UP  Date of Service/Encounter:  07/19/18   Assessment:   Neck swelling - ? bacterial etiology  Pruritus  Seasonal and perennial allergic rhinitis  Shortness of breath  Plan/Recommendations:   1. Neck swelling - I am reassured that it got better with the antibiotic. - We are going to get an ultrasound next Monday at 10:30am.  - This will show whether it is a dense swelling or infectious  2. Seasonal and perennial allergic rhinitis (ragweed, [place] grass, molds, dust mite) - Continue with fluticasone one spray per nostril twice daily.  3. Pruritus - Continue with hydroxyzine as needed at night.  4. Shortness of breath with cough - Continue to hold the Symbicort since you are doing fairly well without it.  - Daily controller medication(s): NONE - Prior to physical activity: ProAir 2 puffs 10-15 minutes before physical activity. - Rescue medications: ProAir 4 puffs every 4-6 hours as needed - Changes during respiratory infections or worsening symptoms: Add on Qvar 80 mcg 2 puffs twice daily for TWO WEEKS. - Asthma control goals:  * Full participation in all desired activities (may need albuterol before activity) * Albuterol use two time or less a week on average (not counting use with activity) * Cough interfering with sleep two time or less a month * Oral steroids no more than once a year * No hospitalizations  5. Return in about 3 months (around 10/19/2018). This can be an in-person, a virtual Webex or a telephone follow up visit.  Subjective:   Bailei Buist is a 58 y.o. female presenting today for follow up of  Chief Complaint  Patient presents with  . Angioedema    Neck swelling 3 weeks     Raniyah Curenton has a history of the following: Patient Active Problem List   Diagnosis Date Noted  . Positive colorectal cancer screening using Cologuard test 02/03/2018  . Shortness of breath 02/02/2018  . Other allergic rhinitis 12/07/2017  . Pruritus  12/07/2017  . Breast calcification, right 10/10/2017    History obtained from: chart review and patient.  Arali is a 58 y.o. female presenting for a follow up visit.  He was last seen on July 04, 2018.  At that time, she was complaining of neck swelling.  We decided to start clindamycin 3 times daily for 1 week.  I did recommend increasing yogurt intake as well.  We asked her to call with an update, but we hear nothing from her.  Evidently, it did not work since she presents with continued neck swelling.  For her allergic rhinitis, we continue with Flonase.  I was still unclear of her diagnosis of asthma, as her history is never been clear-cut.  We continued Symbicort 160/4.5 mcg 1 to 2 puffs twice daily.   Since the last visit, she has mostly done well. She does report that it has gotten smaller. She never developed a fever at all. She still denies pain unless she messes with it.  She denies any kind of discharge.  She has had no problems swallowing.  Her postnasal drip persists, but it is slightly better than last time.  She denies any heat or cold intolerance, weight loss or weight gain, or constipation.  She has had normal thyroid studies in the past, according to the patient.  She has seen Dr. Benjamine Mola in the past, who has done surgery on her nose.  She has called the office a few times but is gotten no response.  She is  still open to seeing him for follow-up of this neck swelling.  From a breathing perspective, she ended up stopping the Symbicort.  She felt that it was causing increased coughing.  Her cough has since improved.  She continues to deny the methacholine challenge.  She has not required any prednisone, ER visits, or urgent care visits for her symptoms.  She denies any chest pain.  Otherwise, there have been no changes to her past medical history, surgical history, family history, or social history.    Review of Systems  Constitutional: Negative.  Negative for chills, fever,  malaise/fatigue and weight loss.  HENT: Negative.  Negative for congestion, ear discharge and ear pain.        Positive for continued neck swelling.  Eyes: Negative for pain, discharge and redness.  Respiratory: Positive for cough. Negative for sputum production, shortness of breath and wheezing.   Cardiovascular: Negative.  Negative for chest pain and palpitations.  Gastrointestinal: Negative for abdominal pain, heartburn, nausea and vomiting.  Skin: Negative.  Negative for itching and rash.  Neurological: Negative for dizziness and headaches.  Endo/Heme/Allergies: Negative for environmental allergies. Does not bruise/bleed easily.       Objective:   Blood pressure 110/74, pulse 83, temperature 97.9 F (36.6 C), temperature source Oral, SpO2 97 %. There is no height or weight on file to calculate BMI.   Physical Exam:  Physical Exam  Constitutional: She appears well-developed.  Very talkative female.  HENT:  Head: Normocephalic and atraumatic.  Right Ear: Tympanic membrane, external ear and ear canal normal.  Left Ear: Tympanic membrane, external ear and ear canal normal.  Nose: Mucosal edema and rhinorrhea present. No nasal deformity or septal deviation. No epistaxis. Right sinus exhibits no maxillary sinus tenderness and no frontal sinus tenderness. Left sinus exhibits no maxillary sinus tenderness and no frontal sinus tenderness.  Mouth/Throat: Uvula is midline and oropharynx is clear and moist. Mucous membranes are not pale and not dry.  She does have a midline neck swelling noted that is mobile and slightly tender to palpation.  It does seem smaller than last time I saw her.  Eyes: Pupils are equal, round, and reactive to light. Conjunctivae and EOM are normal. Right eye exhibits no chemosis and no discharge. Left eye exhibits no chemosis and no discharge. Right conjunctiva is not injected. Left conjunctiva is not injected.  Cardiovascular: Normal rate, regular rhythm and  normal heart sounds.  Respiratory: Effort normal and breath sounds normal. No accessory muscle usage. No tachypnea. No respiratory distress. She has no wheezes. She has no rhonchi. She has no rales. She exhibits no tenderness.  Lymphadenopathy:    She has no cervical adenopathy.  Neurological: She is alert.  Skin: No abrasion, no petechiae and no rash noted. Rash is not papular, not vesicular and not urticarial. No erythema. No pallor.  Psychiatric: She has a normal mood and affect.     Diagnostic studies: none     Salvatore Marvel, MD  [facility]

## 2018-07-19 NOTE — Patient Instructions (Addendum)
1. Neck swelling - I am reassured that it got better with the antibiotic. - We are going to get an ultrasound next Monday at 10:30am.  - This will show whether it is a dense swelling or infectious  2. Seasonal and perennial allergic rhinitis (ragweed, [place] grass, molds, dust mite) - Continue with fluticasone one spray per nostril twice daily.  3. Pruritus - Continue with hydroxyzine as needed at night.  4. Shortness of breath with cough - Continue to hold the Symbicort since you are doing fairly well without it.  - Daily controller medication(s): NONE - Prior to physical activity: ProAir 2 puffs 10-15 minutes before physical activity. - Rescue medications: ProAir 4 puffs every 4-6 hours as needed - Changes during respiratory infections or worsening symptoms: Add on Qvar 80 mcg 2 puffs twice daily for TWO WEEKS. - Asthma control goals:  * Full participation in all desired activities (may need albuterol before activity) * Albuterol use two time or less a week on average (not counting use with activity) * Cough interfering with sleep two time or less a month * Oral steroids no more than once a year * No hospitalizations  5. Return in about 3 months (around 10/19/2018). This can be an in-person, a virtual Webex or a telephone follow up visit.   Please inform us of any Emergency Department visits, hospitalizations, or changes in symptoms. Call us before going to the ED for breathing or allergy symptoms since we might be able to fit you in for a sick visit. Feel free to contact us anytime with any questions, problems, or concerns.  It was a pleasure to see you again today!  Websites that have reliable patient information: 1. American Academy of Asthma, Allergy, and Immunology: www.aaaai.org 2. Food Allergy Research and Education (FARE): foodallergy.org 3. Mothers of Asthmatics: http://www.asthmacommunitynetwork.org 4. American College of Allergy, Asthma, and Immunology: www.acaai.org  "Like" [place] on Facebook and Instagram for our latest updates!      Make sure you are registered to vote! If you have moved or changed any of your contact information, you will need to get this updated before voting!    Voter ID laws are NOT going into effect for the General Election in November 2020! DO NOT let this stop you from exercising your right to vote!   Absentee voting is the SAFEST way to vote during the coronavirus pandemic! Download and print an absentee ballot request form at [email or web address].[email or web address]  More information on absentee ballots can be found here: [email or web address]

## 2018-07-24 ENCOUNTER — Other Ambulatory Visit: Payer: Self-pay

## 2018-07-24 ENCOUNTER — Ambulatory Visit (HOSPITAL_COMMUNITY)
Admission: RE | Admit: 2018-07-24 | Discharge: 2018-07-24 | Disposition: A | Discharge status: Home / Self Care | Payer: BC Managed Care – PPO | Source: Ambulatory Visit | Attending: Allergy & Immunology | Admitting: Allergy & Immunology

## 2018-07-24 DIAGNOSIS — R221 Localized swelling, mass and lump, neck: Secondary | ICD-10-CM | POA: Diagnosis not present

## 2018-07-25 ENCOUNTER — Encounter: Payer: Self-pay | Admitting: Allergy & Immunology

## 2018-08-10 ENCOUNTER — Other Ambulatory Visit: Payer: Self-pay | Admitting: Orthopedic Surgery

## 2018-08-19 ENCOUNTER — Other Ambulatory Visit: Payer: Self-pay | Admitting: Allergy & Immunology

## 2018-08-31 ENCOUNTER — Ambulatory Visit: Payer: BLUE CROSS/BLUE SHIELD | Admitting: Allergy & Immunology

## 2018-09-05 ENCOUNTER — Other Ambulatory Visit: Payer: Self-pay

## 2018-09-05 ENCOUNTER — Encounter: Payer: Self-pay | Admitting: Allergy & Immunology

## 2018-09-05 ENCOUNTER — Ambulatory Visit: Payer: BC Managed Care – PPO | Admitting: Allergy & Immunology

## 2018-09-05 VITALS — BP 98/66 | HR 80 | Temp 98.2°F | Resp 16 | Ht 68.0 in | Wt 225.6 lb

## 2018-09-05 DIAGNOSIS — R221 Localized swelling, mass and lump, neck: Secondary | ICD-10-CM

## 2018-09-05 DIAGNOSIS — R0602 Shortness of breath: Secondary | ICD-10-CM

## 2018-09-05 DIAGNOSIS — J302 Other seasonal allergic rhinitis: Secondary | ICD-10-CM

## 2018-09-05 DIAGNOSIS — J3089 Other allergic rhinitis: Secondary | ICD-10-CM

## 2018-09-05 NOTE — Patient Instructions (Addendum)
1.  Seasonal and perennial allergic rhinitis (ragweed, [place] grass, molds, dust mite) - Continue with fluticasone one spray per nostril twice daily. - Try adding on Allegra one tablet once daily (samples provided).  - You can try decreasing to half a tablet if needed.   2. Pruritus - Continue with hydroxyzine as needed at night.  3. Shortness of breath with cough - Improved without the use of Symbicort.  - Daily controller medication(s): NONE - Prior to physical activity: ProAir 2 puffs 10-15 minutes before physical activity. - Rescue medications: ProAir 4 puffs every 4-6 hours as needed  - Changes during respiratory infections or worsening symptoms: Add on Qvar 80 mcg 2 puffs twice daily for TWO WEEKS. - Asthma control goals:  * Full participation in all desired activities (may need albuterol before activity) * Albuterol use two time or less a week on average (not counting use with activity) * Cough interfering with sleep two time or less a month * Oral steroids no more than once a year * No hospitalizations  4. Return in about 6 months (around 03/08/2019). This can be an in-person, a virtual Webex or a telephone follow up visit.   Please inform us of any Emergency Department visits, hospitalizations, or changes in symptoms. Call us before going to the ED for breathing or allergy symptoms since we might be able to fit you in for a sick visit. Feel free to contact us anytime with any questions, problems, or concerns.  It was a pleasure to see you again today!  Websites that have reliable patient information: 1. American Academy of Asthma, Allergy, and Immunology: www.aaaai.org 2. Food Allergy Research and Education (FARE): foodallergy.org 3. Mothers of Asthmatics: http://www.asthmacommunitynetwork.org 4. American College of Allergy, Asthma, and Immunology: www.acaai.org  "Like" [place] on Facebook and Instagram for our latest updates!      Make sure you are registered to vote! If  you have moved or changed any of your contact information, you will need to get this updated before voting!  In some cases, you MAY be able to register to vote online: [email or web address]    Voter ID laws are NOT going into effect for the General Election in November 2020! DO NOT let this stop you from exercising your right to vote!   Absentee voting is the SAFEST way to vote during the coronavirus pandemic!   Download and print an absentee ballot request form at [email or web address] or you can scan the QR code below with your smart phone:      More information on absentee ballots can be found here: [email or web address]

## 2018-09-05 NOTE — Progress Notes (Signed)
FOLLOW UP  Date of Service/Encounter:  09/05/18   Assessment:   Shortness of breath  Seasonal and perennial allergic rhinitis (ragweed, [place] grass, molds, dust mite)  Neck swelling - ? sebaceous cyst (overall improving)  Plan/Recommendations:   1.  Seasonal and perennial allergic rhinitis (ragweed, [place] grass, molds, dust mite) - Continue with fluticasone one spray per nostril twice daily. - Try adding on Allegra one tablet once daily (samples provided).  - You can try decreasing to half a tablet if needed.   2. Pruritus - Continue with hydroxyzine as needed at night.  3. Shortness of breath with cough - Improved without the use of Symbicort.  - Daily controller medication(s): NONE - Prior to physical activity: ProAir 2 puffs 10-15 minutes before physical activity. - Rescue medications: ProAir 4 puffs every 4-6 hours as needed  - Changes during respiratory infections or worsening symptoms: Add on Qvar 80 mcg 2 puffs twice daily for TWO WEEKS. - Asthma control goals:  * Full participation in all desired activities (may need albuterol before activity) * Albuterol use two time or less a week on average (not counting use with activity) * Cough interfering with sleep two time or less a month * Oral steroids no more than once a year * No hospitalizations  4. Return in about 6 months (around 03/08/2019). This can be an in-person, a virtual Webex or a telephone follow up visit.  Subjective:   Deborah Walter is a 58 y.o. female presenting today for follow up of  Chief Complaint  Patient presents with  . Allergic Rhinitis   . f/u neck swelling    Deborah Walter has a history of the following: Patient Active Problem List   Diagnosis Date Noted  . Positive colorectal cancer screening using Cologuard test 02/03/2018  . Shortness of breath 02/02/2018  . Other allergic rhinitis 12/07/2017  . Pruritus 12/07/2017  . Breast calcification, right 10/10/2017    History obtained  from: chart review and patient.  Deborah Walter is a 58 y.o. female presenting for a follow up visit. She was last seen in June 2020. At that time, she continued to have a neck swelling but it had improved with a round of antibiotics. We did order an ultrasound which was unrevealing.  For her shortness of breath, she was doing fine without the Symbicort so I told her to continue to hold this.  We continued with the pro-air as needed and Qvar added during respiratory flares.  For her rhinitis, we continued with Flonase 1 spray per nostril daily and hydroxyzine as needed at night for the itching.  In the interim, she was seen by her primary care doctor and diagnosed with a sebaceous cyst.  She tells me that she did not do any further evaluation into the lump.  She is going to have her thyroid function tested at some point when she gets her preop blood work.  Her primary care provider has already ordered all of this.  However, at this point, her neck swelling has remained stable.  It is rarely if ever tender at this time.  She was scheduled to have her right shoulder replaced next month, but she put it off due to staffing issues at her workplace.  They are not been to be able to find anyone to temporarily take over until October, so she rescheduled it for that time.  She is anticipating being off of work for a total of 3 months.  Thankfully, her workplace has been more  accommodating than I would have expected given their past history.  Her breathing has been well controlled without the use of any medications at all.  She is not using her rescue inhaler much at all.  She has not needed prednisone or emergency room visits for breathing.  Her rhinitis is fairly well-controlled with the Flonase.  She does report that she feels that there is some mucus in her throat that she is unable to clear.  She has not been able to tolerate antihistamines in the past due to sedation.  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  She is open to  getting a shot.  She has having some family drama.  Her sister who lives in [place] was going to drive her to her surgery.  However, she is now back down.  She is just planning to do an [place] Popper to get back to her house and eating.  Otherwise, there have been no changes to her past medical history, surgical history, family history, or social history.    Review of Systems  Constitutional: Negative.  Negative for fever, malaise/fatigue and weight loss.  HENT: Negative.  Negative for congestion, ear discharge and ear pain.   Eyes: Negative for pain, discharge and redness.  Respiratory: Negative for cough, sputum production, shortness of breath and wheezing.   Cardiovascular: Negative.  Negative for chest pain and palpitations.  Gastrointestinal: Negative for abdominal pain, heartburn and vomiting.  Musculoskeletal: Positive for back pain, myalgias and neck pain.  Skin: Negative.  Negative for itching and rash.  Neurological: Negative for dizziness and headaches.  Endo/Heme/Allergies: Negative for environmental allergies. Does not bruise/bleed easily.       Objective:   Blood pressure 98/66, pulse 80, temperature 98.2 F (36.8 C), temperature source Temporal, resp. rate 16, height 5\' 8"  (1.727 m), weight 225 lb 9.6 oz (102.3 kg), SpO2 96 %. Body mass index is 34.3 kg/m.   Physical Exam:  Physical Exam  Constitutional: She appears well-developed.  HENT:  Head: Normocephalic and atraumatic.  Right Ear: Tympanic membrane, external ear and ear canal normal.  Left Ear: Tympanic membrane and ear canal normal.  Nose: No mucosal edema, rhinorrhea, nasal deformity or septal deviation. No epistaxis. Right sinus exhibits no maxillary sinus tenderness and no frontal sinus tenderness. Left sinus exhibits no maxillary sinus tenderness and no frontal sinus tenderness.  Mouth/Throat: Uvula is midline and oropharynx is clear and moist. Mucous membranes are not pale and not dry.  She continues to have  the midline neck swelling, although it seems smaller than it was in the past.   Eyes: Pupils are equal, round, and reactive to light. Conjunctivae and EOM are normal. Right eye exhibits no chemosis and no discharge. Left eye exhibits no chemosis and no discharge. Right conjunctiva is not injected. Left conjunctiva is not injected.  Cardiovascular: Normal rate, regular rhythm and normal heart sounds.  Respiratory: Effort normal and breath sounds normal. No accessory muscle usage. No tachypnea. No respiratory distress. She has no wheezes. She has no rhonchi. She has no rales. She exhibits no tenderness.  Lymphadenopathy:    She has no cervical adenopathy.  Neurological: She is alert.  Skin: No abrasion, no petechiae and no rash noted. Rash is not papular, not vesicular and not urticarial. No erythema. No pallor.  No eczematous or urticarial lesions noted.   Psychiatric: She has a normal mood and affect.     Diagnostic studies: none    Salvatore Marvel, MD  [facility]

## 2018-09-07 ENCOUNTER — Inpatient Hospital Stay: Admit: 2018-09-07 | Payer: BC Managed Care – PPO | Admitting: Orthopedic Surgery

## 2018-09-21 ENCOUNTER — Other Ambulatory Visit: Payer: Self-pay | Admitting: Orthopedic Surgery

## 2018-10-15 ENCOUNTER — Other Ambulatory Visit: Payer: Self-pay | Admitting: Allergy & Immunology

## 2018-10-17 ENCOUNTER — Other Ambulatory Visit: Payer: Self-pay | Admitting: Allergy & Immunology

## 2018-10-31 NOTE — Patient Instructions (Addendum)
DUE TO COVID-19 ONLY ONE VISITOR IS ALLOWED TO COME WITH YOU AND STAY IN THE WAITING ROOM ONLY DURING PRE OP AND PROCEDURE DAY OF SURGERY. THE 1 VISITOR MAY VISIT WITH YOU AFTER SURGERY IN YOUR PRIVATE ROOM DURING VISITING HOURS ONLY!  YOU NEED TO HAVE A COVID 19 TEST ON_Monday 10/12/2020______ @_0840  am______, THIS TEST MUST BE DONE BEFORE SURGERY, COME TO [facility].  ONCE YOUR COVID TEST IS COMPLETED, PLEASE BEGIN THE QUARANTINE INSTRUCTIONS AS OUTLINED IN YOUR HANDOUT.                Tanara Penafiel    Your procedure is scheduled on: Thursday 11/09/2018   Report to [facility] Main  Entrance    Report to Short Stay at 5:30 AM     Call this number if you have problems the morning of surgery [phone]    Remember: Do not eat food After Midnight.    BRUSH YOUR TEETH MORNING OF SURGERY AND RINSE YOUR MOUTH OUT, NO CHEWING GUM CANDY OR MINTS.    NO SOLID FOOD AFTER MIDNIGHT THE NIGHT PRIOR TO SURGERY. NOTHING BY MOUTH EXCEPT CLEAR LIQUIDS UNTIL 4:30AM.     PLEASE FINISH ENSURE DRINK PER SURGEON ORDER  WHICH NEEDS TO BE COMPLETED AT 4:30AM .   CLEAR LIQUID DIET   Foods Allowed                                                                     Foods Excluded  Coffee and tea, regular and decaf                             liquids that you cannot  Plain Jell-O any favor except red or purple                                           see through such as: Fruit ices (not with fruit pulp)                                     milk, soups, orange juice  Iced Popsicles                                    All solid food Carbonated beverages, regular and diet                                    Cranberry, grape and apple juices Sports drinks like Gatorade Lightly seasoned clear broth or consume(fat free) Sugar, honey syrup  Sample Menu Breakfast                                Lunch  Supper Cranberry juice                    Beef broth                             Chicken broth Jell-O                                     Grape juice                           Apple juice Coffee or tea                        Jell-O                                      Popsicle                                                Coffee or tea                        Coffee or tea  _____________________________________________________________________     Take these medicines the morning of surgery with A SIP OF WATER:   Proair inhaler (if needed), Aero chamber plus inhaler if needed, Flonase (if needed), Eye drops (if needed), (Omeprazole) prilosec (PLEASE BRING [place])                                You may not have any metal on your body including hair pins and              piercings  Do not wear jewelry, make-up, lotions, powders or perfumes, deodorant             Do not wear nail polish on your fingernails.  Do not shave  48 hours prior to surgery.                Do not bring valuables to the hospital. [place].  Contacts, dentures or bridgework may not be worn into surgery.  Leave suitcase in the car. After surgery it may be brought to your room.                  Please read over the following fact sheets you were given: _____________________________________________________________________             [facility] - Preparing for Surgery Before surgery, you can play an important role.  Because skin is not sterile, your skin needs to be as free of germs as possible.  You can reduce the number of germs on your skin by washing with CHG (chlorahexidine gluconate) soap before surgery.  CHG is an antiseptic cleaner which kills germs and bonds with the skin to continue killing germs even after washing. Please DO NOT use if you have an allergy to CHG or  antibacterial soaps.  If your skin becomes reddened/irritated stop using the CHG and inform your nurse when you arrive at Short  Stay. Do not shave (including legs and underarms) for at least 48 hours prior to the first CHG shower.  You may shave your face/neck. Please follow these instructions carefully:  1.  Shower with CHG Soap the night before surgery and the  morning of Surgery.  2.  If you choose to wash your hair, wash your hair first as usual with your  normal  shampoo.  3.  After you shampoo, rinse your hair and body thoroughly to remove the  shampoo.                           4.  Use CHG as you would any other liquid soap.  You can apply chg directly  to the skin and wash                       Gently with a scrungie or clean washcloth.  5.  Apply the CHG Soap to your body ONLY FROM THE NECK DOWN.   Do not use on face/ open                           Wound or open sores. Avoid contact with eyes, ears mouth and genitals (private parts).                       Wash face,  Genitals (private parts) with your normal soap.             6.  Wash thoroughly, paying special attention to the area where your surgery  will be performed.  7.  Thoroughly rinse your body with warm water from the neck down.  8.  DO NOT shower/wash with your normal soap after using and rinsing off  the CHG Soap.                9.  Pat yourself dry with a clean towel.            10.  Wear clean pajamas.            11.  Place clean sheets on your bed the night of your first shower and do not  sleep with pets. Day of Surgery : Do not apply any lotions/deodorants the morning of surgery.  Please wear clean clothes to the hospital/surgery center.  FAILURE TO FOLLOW THESE INSTRUCTIONS MAY RESULT IN THE CANCELLATION OF YOUR SURGERY PATIENT SIGNATURE_________________________________  NURSE SIGNATURE__________________________________  ________________________________________________________________________   Adam Phenix  An incentive spirometer is a tool that can help keep your lungs clear and active. This tool measures how well you are  filling your lungs with each breath. Taking long deep breaths may help reverse or decrease the chance of developing breathing (pulmonary) problems (especially infection) following:  A long period of time when you are unable to move or be active. BEFORE THE PROCEDURE   If the spirometer includes an indicator to show your best effort, your nurse or respiratory therapist will set it to a desired goal.  If possible, sit up straight or lean slightly forward. Try not to slouch.  Hold the incentive spirometer in an upright position. INSTRUCTIONS FOR USE  1. Sit on the edge of your bed if possible, or sit up as far as you can in  bed or on a chair. 2. Hold the incentive spirometer in an upright position. 3. Breathe out normally. 4. Place the mouthpiece in your mouth and seal your lips tightly around it. 5. Breathe in slowly and as deeply as possible, raising the piston or the ball toward the top of the column. 6. Hold your breath for 3-5 seconds or for as long as possible. Allow the piston or ball to fall to the bottom of the column. 7. Remove the mouthpiece from your mouth and breathe out normally. 8. Rest for a few seconds and repeat Steps 1 through 7 at least 10 times every 1-2 hours when you are awake. Take your time and take a few normal breaths between deep breaths. 9. The spirometer may include an indicator to show your best effort. Use the indicator as a goal to work toward during each repetition. 10. After each set of 10 deep breaths, practice coughing to be sure your lungs are clear. If you have an incision (the cut made at the time of surgery), support your incision when coughing by placing a pillow or rolled up towels firmly against it. Once you are able to get out of bed, walk around indoors and cough well. You may stop using the incentive spirometer when instructed by your caregiver.  RISKS AND COMPLICATIONS  Take your time so you do not get dizzy or light-headed.  If you are in pain,  you may need to take or ask for pain medication before doing incentive spirometry. It is harder to take a deep breath if you are having pain. AFTER USE  Rest and breathe slowly and easily.  It can be helpful to keep track of a log of your progress. Your caregiver can provide you with a simple table to help with this. If you are using the spirometer at home, follow these instructions: [place] IF:   You are having difficultly using the spirometer.  You have trouble using the spirometer as often as instructed.  Your pain medication is not giving enough relief while using the spirometer.  You develop fever of 100.5 F (38.1 C) or higher. SEEK IMMEDIATE MEDICAL CARE IF:   You cough up bloody sputum that had not been present before.  You develop fever of 102 F (38.9 C) or greater.  You develop worsening pain at or near the incision site. MAKE SURE YOU:   Understand these instructions.  Will watch your condition.  Will get help right away if you are not doing well or get worse. Document Released: 05/24/2006 Document Revised: 04/05/2011 Document Reviewed: 07/25/2006 [facility] Patient Information 2014 [address], [place].   ________________________________________________________________________

## 2018-11-02 ENCOUNTER — Encounter (HOSPITAL_COMMUNITY)
Admission: RE | Admit: 2018-11-02 | Discharge: 2018-11-02 | Disposition: A | Discharge status: Home / Self Care | Payer: BC Managed Care – PPO | Source: Ambulatory Visit | Attending: Orthopedic Surgery | Admitting: Orthopedic Surgery

## 2018-11-02 ENCOUNTER — Encounter (HOSPITAL_COMMUNITY): Payer: Self-pay

## 2018-11-02 ENCOUNTER — Other Ambulatory Visit: Payer: Self-pay

## 2018-11-02 DIAGNOSIS — M19011 Primary osteoarthritis, right shoulder: Secondary | ICD-10-CM | POA: Insufficient documentation

## 2018-11-02 DIAGNOSIS — Z01818 Encounter for other preprocedural examination: Secondary | ICD-10-CM | POA: Diagnosis present

## 2018-11-02 HISTORY — DX: Family history of other specified conditions: Z84.89

## 2018-11-02 HISTORY — DX: Unspecified osteoarthritis, unspecified site: M19.90

## 2018-11-02 HISTORY — DX: Allergy status to unspecified drugs, medicaments and biological substances: Z88.9

## 2018-11-02 LAB — URINALYSIS, ROUTINE W REFLEX MICROSCOPIC
Bilirubin Urine: NEGATIVE
Glucose, UA: NEGATIVE mg/dL
Ketones, ur: NEGATIVE mg/dL
Leukocytes,Ua: NEGATIVE
Nitrite: NEGATIVE
Protein, ur: NEGATIVE mg/dL
Specific Gravity, Urine: 1.003 — ABNORMAL LOW (ref 1.005–1.030)
pH: 5 (ref 5.0–8.0)

## 2018-11-02 LAB — SURGICAL PCR SCREEN
MRSA, PCR: NEGATIVE
Staphylococcus aureus: NEGATIVE

## 2018-11-02 LAB — PROTIME-INR
INR: 0.9 (ref 0.8–1.2)
Prothrombin Time: 12.2 seconds (ref 11.4–15.2)

## 2018-11-02 LAB — APTT: aPTT: 29 seconds (ref 24–36)

## 2018-11-02 NOTE — Progress Notes (Addendum)
PCP - Dr. Chapman Fitch  [facility] [place], [facility]- from Dr. Lorra Hals on 11/02/2018 on chart- Hepatitis C antibody, Lipid panel, HgA1C, CMP,CBC w/diff.,   Cardiologist - None  Chest x-ray -none  EKG - 02/22/2018, 11/02/2018 on chart from [facility] -none  ECHO - none Cardiac Cath - none  Sleep Study - none CPAP -   Fasting Blood Sugar - none Checks Blood Sugar _____ times a day  Blood Thinner Instructions:none Aspirin Instructions:none Last Dose:  Anesthesia review:  Chart given to Konrad Felix, PA for review  Patient denies shortness of breath, fever, cough and chest pain at PAT appointment   Patient verbalized understanding of instructions that were given to them at the PAT appointment. Patient was also instructed that they will need to review over the PAT instructions again at home before surgery.

## 2018-11-06 ENCOUNTER — Other Ambulatory Visit (HOSPITAL_COMMUNITY)
Admission: RE | Admit: 2018-11-06 | Discharge: 2018-11-06 | Disposition: A | Discharge status: Home / Self Care | Payer: BC Managed Care – PPO | Source: Ambulatory Visit | Attending: Orthopedic Surgery | Admitting: Orthopedic Surgery

## 2018-11-06 ENCOUNTER — Other Ambulatory Visit: Payer: Self-pay

## 2018-11-06 DIAGNOSIS — Z01812 Encounter for preprocedural laboratory examination: Secondary | ICD-10-CM | POA: Diagnosis present

## 2018-11-06 DIAGNOSIS — Z20828 Contact with and (suspected) exposure to other viral communicable diseases: Secondary | ICD-10-CM | POA: Insufficient documentation

## 2018-11-06 LAB — SARS CORONAVIRUS 2 (TAT 6-24 HRS): SARS Coronavirus 2: NEGATIVE

## 2018-11-07 ENCOUNTER — Other Ambulatory Visit: Payer: Self-pay | Admitting: Orthopedic Surgery

## 2018-11-09 ENCOUNTER — Inpatient Hospital Stay (HOSPITAL_COMMUNITY): Payer: BC Managed Care – PPO | Admitting: Physician Assistant

## 2018-11-09 ENCOUNTER — Inpatient Hospital Stay (HOSPITAL_COMMUNITY): Payer: BC Managed Care – PPO | Admitting: Certified Registered Nurse Anesthetist

## 2018-11-09 ENCOUNTER — Encounter (HOSPITAL_COMMUNITY): Payer: Self-pay | Admitting: *Deleted

## 2018-11-09 ENCOUNTER — Encounter (HOSPITAL_COMMUNITY)
Admission: RE | Disposition: A | Discharge status: Home / Self Care | Payer: Self-pay | Source: Home / Self Care | Attending: Orthopedic Surgery

## 2018-11-09 ENCOUNTER — Inpatient Hospital Stay (HOSPITAL_COMMUNITY)
Admission: RE | Admit: 2018-11-09 | Discharge: 2018-11-10 | DRG: 483 | Disposition: A | Discharge status: Home / Self Care | Payer: BC Managed Care – PPO | Attending: Orthopedic Surgery | Admitting: Orthopedic Surgery

## 2018-11-09 ENCOUNTER — Inpatient Hospital Stay (HOSPITAL_COMMUNITY): Payer: BC Managed Care – PPO

## 2018-11-09 ENCOUNTER — Other Ambulatory Visit: Payer: Self-pay

## 2018-11-09 DIAGNOSIS — Z880 Allergy status to penicillin: Secondary | ICD-10-CM | POA: Diagnosis not present

## 2018-11-09 DIAGNOSIS — Z888 Allergy status to other drugs, medicaments and biological substances status: Secondary | ICD-10-CM

## 2018-11-09 DIAGNOSIS — K219 Gastro-esophageal reflux disease without esophagitis: Secondary | ICD-10-CM | POA: Diagnosis present

## 2018-11-09 DIAGNOSIS — Z96611 Presence of right artificial shoulder joint: Secondary | ICD-10-CM

## 2018-11-09 DIAGNOSIS — Z87891 Personal history of nicotine dependence: Secondary | ICD-10-CM

## 2018-11-09 DIAGNOSIS — Z886 Allergy status to analgesic agent status: Secondary | ICD-10-CM | POA: Diagnosis not present

## 2018-11-09 DIAGNOSIS — Z825 Family history of asthma and other chronic lower respiratory diseases: Secondary | ICD-10-CM | POA: Diagnosis not present

## 2018-11-09 DIAGNOSIS — M25711 Osteophyte, right shoulder: Secondary | ICD-10-CM | POA: Diagnosis present

## 2018-11-09 DIAGNOSIS — Z79899 Other long term (current) drug therapy: Secondary | ICD-10-CM

## 2018-11-09 DIAGNOSIS — Z881 Allergy status to other antibiotic agents status: Secondary | ICD-10-CM | POA: Diagnosis not present

## 2018-11-09 DIAGNOSIS — M7521 Bicipital tendinitis, right shoulder: Secondary | ICD-10-CM | POA: Diagnosis present

## 2018-11-09 DIAGNOSIS — I10 Essential (primary) hypertension: Secondary | ICD-10-CM | POA: Diagnosis present

## 2018-11-09 DIAGNOSIS — M19011 Primary osteoarthritis, right shoulder: Principal | ICD-10-CM | POA: Diagnosis present

## 2018-11-09 DIAGNOSIS — J302 Other seasonal allergic rhinitis: Secondary | ICD-10-CM | POA: Diagnosis present

## 2018-11-09 DIAGNOSIS — Z7951 Long term (current) use of inhaled steroids: Secondary | ICD-10-CM | POA: Diagnosis not present

## 2018-11-09 HISTORY — PX: TOTAL SHOULDER ARTHROPLASTY: SHX126

## 2018-11-09 SURGERY — ARTHROPLASTY, SHOULDER, TOTAL
Anesthesia: Choice | Site: Shoulder | Laterality: Right

## 2018-11-09 SURGERY — ARTHROPLASTY, SHOULDER, TOTAL
Anesthesia: Regional | Site: Shoulder | Laterality: Right

## 2018-11-09 MED ORDER — CHLORHEXIDINE GLUCONATE 4 % EX LIQD: 60.0000 mL | Freq: Once | CUTANEOUS | Status: DC

## 2018-11-09 MED ORDER — TRANEXAMIC ACID-NACL 1000-0.7 MG/100ML-% IV SOLN
1000.0000 mg | INTRAVENOUS | Status: AC
  Administered 2018-11-09: 1000 mg via INTRAVENOUS
  Filled 2018-11-09: qty 100

## 2018-11-09 MED ORDER — LIDOCAINE 2% (20 MG/ML) 5 ML SYRINGE
INTRAMUSCULAR | Status: DC | PRN
  Administered 2018-11-09: 60 mg via INTRAVENOUS

## 2018-11-09 MED ORDER — ONDANSETRON HCL 4 MG PO TABS: 4.0000 mg | ORAL_TABLET | Freq: Four times a day (QID) | ORAL | Status: DC | PRN

## 2018-11-09 MED ORDER — METHOCARBAMOL 500 MG IVPB - SIMPLE MED
500.0000 mg | Freq: Four times a day (QID) | INTRAVENOUS | Status: DC | PRN
  Administered 2018-11-09: 10:00:00 500 mg via INTRAVENOUS
  Filled 2018-11-09: qty 50

## 2018-11-09 MED ORDER — BUPIVACAINE HCL (PF) 0.5 % IJ SOLN
INTRAMUSCULAR | Status: DC | PRN
  Administered 2018-11-09: 15 mL via PERINEURAL

## 2018-11-09 MED ORDER — PHENYLEPHRINE 40 MCG/ML (10ML) SYRINGE FOR IV PUSH (FOR BLOOD PRESSURE SUPPORT)
PREFILLED_SYRINGE | INTRAVENOUS | Status: AC
  Filled 2018-11-09: qty 10

## 2018-11-09 MED ORDER — ZOLPIDEM TARTRATE 5 MG PO TABS: 5.0000 mg | ORAL_TABLET | Freq: Every evening | ORAL | Status: DC | PRN

## 2018-11-09 MED ORDER — ALBUTEROL SULFATE HFA 108 (90 BASE) MCG/ACT IN AERS: 2.0000 | INHALATION_SPRAY | Freq: Four times a day (QID) | RESPIRATORY_TRACT | Status: DC | PRN

## 2018-11-09 MED ORDER — ONDANSETRON HCL 4 MG/2ML IJ SOLN
INTRAMUSCULAR | Status: AC
  Filled 2018-11-09: qty 2

## 2018-11-09 MED ORDER — METHOCARBAMOL 500 MG IVPB - SIMPLE MED
INTRAVENOUS | Status: AC
  Administered 2018-11-09: 500 mg via INTRAVENOUS
  Filled 2018-11-09: qty 50

## 2018-11-09 MED ORDER — ONDANSETRON HCL 4 MG/2ML IJ SOLN: 4.0000 mg | Freq: Four times a day (QID) | INTRAMUSCULAR | Status: DC | PRN

## 2018-11-09 MED ORDER — SUCCINYLCHOLINE CHLORIDE 20 MG/ML IJ SOLN
INTRAMUSCULAR | Status: DC | PRN
  Administered 2018-11-09: 120 mg via INTRAVENOUS

## 2018-11-09 MED ORDER — FAMOTIDINE 20 MG PO TABS
40.0000 mg | ORAL_TABLET | Freq: Every day | ORAL | Status: DC
  Administered 2018-11-09: 40 mg via ORAL
  Filled 2018-11-09: qty 2

## 2018-11-09 MED ORDER — PROPOFOL 10 MG/ML IV BOLUS
INTRAVENOUS | Status: DC | PRN
  Administered 2018-11-09: 150 mg via INTRAVENOUS

## 2018-11-09 MED ORDER — METOCLOPRAMIDE HCL 5 MG PO TABS: 5.0000 mg | ORAL_TABLET | Freq: Three times a day (TID) | ORAL | Status: DC | PRN

## 2018-11-09 MED ORDER — MIDAZOLAM HCL 5 MG/5ML IJ SOLN
INTRAMUSCULAR | Status: DC | PRN
  Administered 2018-11-09: 2 mg via INTRAVENOUS

## 2018-11-09 MED ORDER — OXYCODONE HCL 5 MG/5ML PO SOLN: 5.0000 mg | Freq: Once | ORAL | Status: DC | PRN

## 2018-11-09 MED ORDER — FENTANYL CITRATE (PF) 250 MCG/5ML IJ SOLN
INTRAMUSCULAR | Status: AC
  Filled 2018-11-09: qty 5

## 2018-11-09 MED ORDER — ACETAMINOPHEN 10 MG/ML IV SOLN
INTRAVENOUS | Status: AC
  Administered 2018-11-09: 1000 mg via INTRAVENOUS
  Filled 2018-11-09: qty 100

## 2018-11-09 MED ORDER — OXYCODONE HCL 5 MG PO TABS: 5.0000 mg | ORAL_TABLET | Freq: Once | ORAL | Status: DC | PRN

## 2018-11-09 MED ORDER — HYDROMORPHONE HCL 1 MG/ML IJ SOLN
0.5000 mg | INTRAMUSCULAR | Status: DC | PRN
  Administered 2018-11-09: 0.5 mg via INTRAVENOUS
  Filled 2018-11-09 (×3): qty 1

## 2018-11-09 MED ORDER — BUPIVACAINE LIPOSOME 1.3 % IJ SUSP
INTRAMUSCULAR | Status: DC | PRN
  Administered 2018-11-09: 133 mg via PERINEURAL

## 2018-11-09 MED ORDER — ACETAMINOPHEN 500 MG PO TABS
1000.0000 mg | ORAL_TABLET | Freq: Four times a day (QID) | ORAL | Status: DC
  Administered 2018-11-09 – 2018-11-10 (×3): 1000 mg via ORAL
  Filled 2018-11-09 (×3): qty 2

## 2018-11-09 MED ORDER — ACETAMINOPHEN 500 MG PO TABS: 1000.0000 mg | ORAL_TABLET | Freq: Once | ORAL | Status: DC | PRN

## 2018-11-09 MED ORDER — OXYCODONE HCL 5 MG PO TABS
5.0000 mg | ORAL_TABLET | ORAL | Status: DC | PRN
  Administered 2018-11-09: 10 mg via ORAL
  Administered 2018-11-09: 5 mg via ORAL
  Filled 2018-11-09: qty 1
  Filled 2018-11-09 (×4): qty 2

## 2018-11-09 MED ORDER — OXYCODONE HCL 5 MG PO TABS
10.0000 mg | ORAL_TABLET | ORAL | Status: DC | PRN
  Administered 2018-11-10 (×3): 10 mg via ORAL

## 2018-11-09 MED ORDER — ASPIRIN EC 81 MG PO TBEC
81.0000 mg | DELAYED_RELEASE_TABLET | Freq: Two times a day (BID) | ORAL | Status: DC
  Administered 2018-11-09 – 2018-11-10 (×2): 81 mg via ORAL
  Filled 2018-11-09 (×2): qty 1

## 2018-11-09 MED ORDER — PHENYLEPHRINE 40 MCG/ML (10ML) SYRINGE FOR IV PUSH (FOR BLOOD PRESSURE SUPPORT)
PREFILLED_SYRINGE | INTRAVENOUS | Status: DC | PRN
  Administered 2018-11-09: 120 ug via INTRAVENOUS
  Administered 2018-11-09: 80 ug via INTRAVENOUS

## 2018-11-09 MED ORDER — ALUMINUM HYDROXIDE GEL 320 MG/5ML PO SUSP
15.0000 mL | ORAL | Status: DC | PRN
  Filled 2018-11-09: qty 30

## 2018-11-09 MED ORDER — METOCLOPRAMIDE HCL 5 MG/ML IJ SOLN: 5.0000 mg | Freq: Three times a day (TID) | INTRAMUSCULAR | Status: DC | PRN

## 2018-11-09 MED ORDER — LISINOPRIL 20 MG PO TABS
20.0000 mg | ORAL_TABLET | Freq: Every day | ORAL | Status: DC
  Administered 2018-11-09 – 2018-11-10 (×2): 20 mg via ORAL
  Filled 2018-11-09 (×2): qty 1

## 2018-11-09 MED ORDER — MENTHOL 3 MG MT LOZG: 1.0000 | LOZENGE | OROMUCOSAL | Status: DC | PRN

## 2018-11-09 MED ORDER — DIPHENHYDRAMINE HCL 12.5 MG/5ML PO ELIX: 12.5000 mg | ORAL_SOLUTION | ORAL | Status: DC | PRN

## 2018-11-09 MED ORDER — HYDROXYZINE HCL 10 MG PO TABS
10.0000 mg | ORAL_TABLET | Freq: Every evening | ORAL | Status: DC | PRN
  Filled 2018-11-09: qty 1

## 2018-11-09 MED ORDER — METHOCARBAMOL 500 MG PO TABS
500.0000 mg | ORAL_TABLET | Freq: Four times a day (QID) | ORAL | Status: DC | PRN
  Administered 2018-11-09 – 2018-11-10 (×3): 500 mg via ORAL
  Filled 2018-11-09 (×3): qty 1

## 2018-11-09 MED ORDER — SENNOSIDES-DOCUSATE SODIUM 8.6-50 MG PO TABS: 1.0000 | ORAL_TABLET | Freq: Every evening | ORAL | Status: DC | PRN

## 2018-11-09 MED ORDER — DOCUSATE SODIUM 100 MG PO CAPS
100.0000 mg | ORAL_CAPSULE | Freq: Two times a day (BID) | ORAL | Status: DC
  Administered 2018-11-09: 100 mg via ORAL
  Filled 2018-11-09: qty 1

## 2018-11-09 MED ORDER — ONDANSETRON HCL 4 MG/2ML IJ SOLN
INTRAMUSCULAR | Status: DC | PRN
  Administered 2018-11-09: 4 mg via INTRAVENOUS

## 2018-11-09 MED ORDER — FENTANYL CITRATE (PF) 100 MCG/2ML IJ SOLN
INTRAMUSCULAR | Status: AC
  Administered 2018-11-09: 50 ug via INTRAVENOUS
  Filled 2018-11-09: qty 2

## 2018-11-09 MED ORDER — SUGAMMADEX SODIUM 200 MG/2ML IV SOLN
INTRAVENOUS | Status: DC | PRN
  Administered 2018-11-09: 200 mg via INTRAVENOUS

## 2018-11-09 MED ORDER — HYDROCHLOROTHIAZIDE 25 MG PO TABS
25.0000 mg | ORAL_TABLET | Freq: Every day | ORAL | Status: DC
  Administered 2018-11-10: 25 mg via ORAL
  Filled 2018-11-09: qty 1

## 2018-11-09 MED ORDER — LISINOPRIL-HYDROCHLOROTHIAZIDE 20-25 MG PO TABS: 1.0000 | ORAL_TABLET | Freq: Every day | ORAL | Status: DC

## 2018-11-09 MED ORDER — ACETAMINOPHEN 160 MG/5ML PO SOLN: 1000.0000 mg | Freq: Once | ORAL | Status: DC | PRN

## 2018-11-09 MED ORDER — ROCURONIUM BROMIDE 10 MG/ML (PF) SYRINGE
PREFILLED_SYRINGE | INTRAVENOUS | Status: DC | PRN
  Administered 2018-11-09: 50 mg via INTRAVENOUS
  Administered 2018-11-09: 10 mg via INTRAVENOUS

## 2018-11-09 MED ORDER — FENTANYL CITRATE (PF) 100 MCG/2ML IJ SOLN
25.0000 ug | INTRAMUSCULAR | Status: DC | PRN
  Administered 2018-11-09: 10:00:00 50 ug via INTRAVENOUS

## 2018-11-09 MED ORDER — ACETAMINOPHEN 10 MG/ML IV SOLN
1000.0000 mg | Freq: Once | INTRAVENOUS | Status: DC | PRN
  Administered 2018-11-09: 10:00:00 1000 mg via INTRAVENOUS

## 2018-11-09 MED ORDER — FLEET ENEMA 7-19 GM/118ML RE ENEM: 1.0000 | ENEMA | Freq: Once | RECTAL | Status: DC | PRN

## 2018-11-09 MED ORDER — VANCOMYCIN HCL IN DEXTROSE 1-5 GM/200ML-% IV SOLN
1000.0000 mg | INTRAVENOUS | Status: AC
  Administered 2018-11-09: 1000 mg via INTRAVENOUS
  Filled 2018-11-09: qty 200

## 2018-11-09 MED ORDER — SODIUM CHLORIDE 0.9 % IR SOLN
Status: DC | PRN
  Administered 2018-11-09 (×2): 1000 mL

## 2018-11-09 MED ORDER — PHENOL 1.4 % MT LIQD
1.0000 | OROMUCOSAL | Status: DC | PRN
  Filled 2018-11-09: qty 177

## 2018-11-09 MED ORDER — BISACODYL 5 MG PO TBEC: 5.0000 mg | DELAYED_RELEASE_TABLET | Freq: Every day | ORAL | Status: DC | PRN

## 2018-11-09 MED ORDER — ALBUTEROL SULFATE (2.5 MG/3ML) 0.083% IN NEBU: 2.5000 mg | INHALATION_SOLUTION | Freq: Four times a day (QID) | RESPIRATORY_TRACT | Status: DC | PRN

## 2018-11-09 MED ORDER — PANTOPRAZOLE SODIUM 40 MG PO TBEC: 40.0000 mg | DELAYED_RELEASE_TABLET | Freq: Every day | ORAL | Status: DC

## 2018-11-09 MED ORDER — MIDAZOLAM HCL 2 MG/2ML IJ SOLN
INTRAMUSCULAR | Status: AC
  Filled 2018-11-09: qty 2

## 2018-11-09 MED ORDER — SODIUM CHLORIDE 0.9 % IV SOLN
INTRAVENOUS | Status: AC
  Administered 2018-11-09 (×2): via INTRAVENOUS

## 2018-11-09 MED ORDER — FENTANYL CITRATE (PF) 100 MCG/2ML IJ SOLN
INTRAMUSCULAR | Status: DC | PRN
  Administered 2018-11-09 (×4): 50 ug via INTRAVENOUS

## 2018-11-09 MED ORDER — LACTATED RINGERS IV SOLN
INTRAVENOUS | Status: DC
  Administered 2018-11-09 (×2): via INTRAVENOUS

## 2018-11-09 MED ORDER — ACETAMINOPHEN 325 MG PO TABS: 325.0000 mg | ORAL_TABLET | Freq: Four times a day (QID) | ORAL | Status: DC | PRN

## 2018-11-09 MED ORDER — CLINDAMYCIN PHOSPHATE 600 MG/50ML IV SOLN
600.0000 mg | Freq: Four times a day (QID) | INTRAVENOUS | Status: AC
  Administered 2018-11-09 (×3): 600 mg via INTRAVENOUS
  Filled 2018-11-09 (×3): qty 50

## 2018-11-09 MED ORDER — DEXAMETHASONE SODIUM PHOSPHATE 10 MG/ML IJ SOLN
INTRAMUSCULAR | Status: DC | PRN
  Administered 2018-11-09: 5 mg via INTRAVENOUS

## 2018-11-09 MED ORDER — PROPOFOL 10 MG/ML IV BOLUS
INTRAVENOUS | Status: AC
  Filled 2018-11-09: qty 20

## 2018-11-09 SURGICAL SUPPLY — 74 items
BAG ZIPLOCK 12X15 (MISCELLANEOUS) ×3 IMPLANT
BIT DRILL 1.6MX128 (BIT) ×2 IMPLANT
BIT DRILL 1.6MX128MM (BIT) ×1
BIT DRILL 2.4X128 (BIT) IMPLANT
BIT DRILL 2.4X128MM (BIT)
BLADE SAW SAG 73X25 THK (BLADE) ×2
BLADE SAW SGTL 18X1.27X75 (BLADE) IMPLANT
BLADE SAW SGTL 18X1.27X75MM (BLADE)
BLADE SAW SGTL 73X25 THK (BLADE) ×1 IMPLANT
CEMENT BONE DEPUY (Cement) ×3 IMPLANT
CHLORAPREP W/TINT 26 (MISCELLANEOUS) ×6 IMPLANT
CLOSURE WOUND 1/2 X4 (GAUZE/BANDAGES/DRESSINGS) ×1
COOLER ICEMAN CLASSIC (MISCELLANEOUS) ×3 IMPLANT
COVER BACK TABLE 60X90IN (DRAPES) ×3 IMPLANT
COVER SURGICAL LIGHT HANDLE (MISCELLANEOUS) ×3 IMPLANT
COVER WAND RF STERILE (DRAPES) IMPLANT
DRAPE INCISE IOBAN 66X45 STRL (DRAPES) ×3 IMPLANT
DRAPE ORTHO SPLIT 77X108 STRL (DRAPES) ×4
DRAPE POUCH INSTRU U-SHP 10X18 (DRAPES) ×3 IMPLANT
DRAPE SURG 17X11 SM STRL (DRAPES) ×3 IMPLANT
DRAPE SURG ORHT 6 SPLT 77X108 (DRAPES) ×2 IMPLANT
DRAPE U-SHAPE 47X51 STRL (DRAPES) ×3 IMPLANT
DRSG ADAPTIC 3X8 NADH LF (GAUZE/BANDAGES/DRESSINGS) ×3 IMPLANT
DRSG AQUACEL AG ADV 3.5X 6 (GAUZE/BANDAGES/DRESSINGS) ×3 IMPLANT
ELECT BLADE TIP CTD 4 INCH (ELECTRODE) ×3 IMPLANT
ELECT REM PT RETURN 15FT ADLT (MISCELLANEOUS) ×3 IMPLANT
GLENOID PEG SHOULDER 40MM SML (Shoulder) ×1 IMPLANT
GLOVE BIO SURGEON STRL SZ7 (GLOVE) ×3 IMPLANT
GLOVE BIO SURGEON STRL SZ7.5 (GLOVE) ×3 IMPLANT
GLOVE BIOGEL PI IND STRL 7.0 (GLOVE) ×1 IMPLANT
GLOVE BIOGEL PI IND STRL 8 (GLOVE) ×1 IMPLANT
GLOVE BIOGEL PI INDICATOR 7.0 (GLOVE) ×2
GLOVE BIOGEL PI INDICATOR 8 (GLOVE) ×2
GOWN STRL REUS W/TWL LRG LVL3 (GOWN DISPOSABLE) ×3 IMPLANT
GOWN STRL REUS W/TWL XL LVL3 (GOWN DISPOSABLE) ×3 IMPLANT
GUIDEWIRE GLENOID 2.5X220 (WIRE) ×3 IMPLANT
HANDPIECE INTERPULSE COAX TIP (DISPOSABLE) ×2
HEAD HUMERAL AEQUALIS 48X18 (Head) ×3 IMPLANT
HEAD HUMERAL HIGH OS 48X18 (Head) ×1 IMPLANT
HEMOSTAT SURGICEL 2X14 (HEMOSTASIS) ×3 IMPLANT
HOOD PEEL AWAY FLYTE STAYCOOL (MISCELLANEOUS) ×9 IMPLANT
KIT BASIN OR (CUSTOM PROCEDURE TRAY) ×3 IMPLANT
KIT TURNOVER KIT A (KITS) IMPLANT
MANIFOLD NEPTUNE II (INSTRUMENTS) ×3 IMPLANT
MARKER SKIN DUAL TIP RULER LAB (MISCELLANEOUS) ×3 IMPLANT
NEEDLE MA TROC 1/2 (NEEDLE) ×3 IMPLANT
NEEDLE MA TROC 1/2 CIR (NEEDLE) ×3 IMPLANT
NS IRRIG 1000ML POUR BTL (IV SOLUTION) ×3 IMPLANT
PACK SHOULDER (CUSTOM PROCEDURE TRAY) ×3 IMPLANT
PAD COLD SHLDR WRAP-ON (PAD) ×3 IMPLANT
PROTECTOR NERVE ULNAR (MISCELLANEOUS) ×3 IMPLANT
RESTRAINT HEAD UNIVERSAL NS (MISCELLANEOUS) ×3 IMPLANT
RETRIEVER SUT HEWSON (MISCELLANEOUS) ×3 IMPLANT
SET HNDPC FAN SPRY TIP SCT (DISPOSABLE) ×1 IMPLANT
SHOULDER GLENOID PEG 40MM SML (Shoulder) ×3 IMPLANT
SLING ARM IMMOBILIZER LRG (SOFTGOODS) ×3 IMPLANT
SMARTMIX MINI TOWER (MISCELLANEOUS)
SPONGE LAP 18X18 RF (DISPOSABLE) ×3 IMPLANT
STEM HUMERAL AEQUALIS 70XS2C (Stem) ×3 IMPLANT
STRIP CLOSURE SKIN 1/2X4 (GAUZE/BANDAGES/DRESSINGS) ×2 IMPLANT
SUCTION FRAZIER HANDLE 12FR (TUBING) ×2
SUCTION TUBE FRAZIER 12FR DISP (TUBING) ×1 IMPLANT
SUPPORT WRAP ARM LG (MISCELLANEOUS) ×3 IMPLANT
SUT ETHIBOND 2 V 37 (SUTURE) ×3 IMPLANT
SUT MNCRL AB 4-0 PS2 18 (SUTURE) ×3 IMPLANT
SUT VIC AB 2-0 CT1 27 (SUTURE) ×2
SUT VIC AB 2-0 CT1 TAPERPNT 27 (SUTURE) ×1 IMPLANT
TAPE FIBER 2MM 7IN #2 BLUE (SUTURE) ×6 IMPLANT
TAPE LABRALWHITE 1.5X36 (TAPE) ×3 IMPLANT
TAPE SUT LABRALTAP WHT/BLK (SUTURE) ×3 IMPLANT
TOWEL OR 17X26 10 PK STRL BLUE (TOWEL DISPOSABLE) ×3 IMPLANT
TOWEL OR NON WOVEN STRL DISP B (DISPOSABLE) ×3 IMPLANT
TOWER SMARTMIX MINI (MISCELLANEOUS) IMPLANT
WATER STERILE IRR 1000ML POUR (IV SOLUTION) ×6 IMPLANT

## 2018-11-09 NOTE — Transfer of Care (Signed)
Immediate Anesthesia Transfer of Care Note  Patient: Deborah Walter  Procedure(s) Performed: TOTAL SHOULDER ARTHROPLASTY (Right Shoulder)  Patient Location: PACU  Anesthesia Type:General  Level of Consciousness: sedated, patient cooperative and responds to stimulation  Airway & Oxygen Therapy: Patient Spontanous Breathing and Patient connected to face mask oxygen  Post-op Assessment: Report given to RN and Post -op Vital signs reviewed and stable  Post vital signs: Reviewed and stable  Last Vitals:  Vitals Value Taken Time  BP 141/87 11/09/18 0920  Temp    Pulse 98 11/09/18 0920  Resp 20 11/09/18 0920  SpO2 95 % 11/09/18 0920  Vitals shown include unvalidated device data.  Last Pain:  Vitals:   11/09/18 0539  TempSrc: Oral      Patients Stated Pain Goal: 4 ([id number])  Complications: No apparent anesthesia complications

## 2018-11-09 NOTE — Discharge Instructions (Signed)
Discharge Instructions after Total Shoulder Arthroplasty    A sling has been provided for you. Remove the sling 5 times each day to perform motion exercises. Wear the sling until your first post op visit w Dr. Tamera Punt  Use ice on the shoulder intermittently over the first 48 hours after surgery.   Pain medication has been prescribed for you.   Use your medication liberally over the first 48 hours, and then begin to taper your use. You may take Extra Strength Tylenol or Tylenol only in place of the pain pills. DO NOT take ANY nonsteroidal anti-inflammatory pain medications: Advil, Motrin, Ibuprofen, Aleve, Naproxen, or Naprosyn.  Take one aspirin a day for 2 weeks after surgery, unless you have an aspirin sensitivity/allergy or asthma.  Leave your dressing on until your first follow up visit.  You may shower with the dressing.  Hold your arm as if you still have your sling on while you shower.  Active reaching and lifting are not permitted. You may use the operative arm for activities of daily living that do not require the operative arm to leave the side of the body, such as eating, drinking, bathing, etc.   Three to 5 times each day you should perform assisted overhead reaching and external rotation (outward turning) exercises with the operative arm. You were taught these exercises prior to discharge. Both exercises should be done with the non-operative arm used as the "therapist arm" while the operative arm remains relaxed. Ten of each exercise should be done three to five times each day.   Overhead reach is helping to lift your stiff arm up as high as it will go. To stretch your overhead reach, lie flat on your back, relax, and grasp the wrist of the tight shoulder with your opposite hand. Using the power in your opposite arm, bring the stiff arm up as far as it is comfortable. Start holding it for ten seconds and then work up to where you can hold it for a count of 30. Breathe slowly and  deeply while the arm is moved. Repeat this stretch ten times, trying to help the ar up a little higher each time.     External rotation is turning the arm out to the side while your elbow stays close to your body. External rotation is best stretched while you are lying on your back. Hold a cane, yardstick, broom handle, or dowel in both hands. Bend both elbows to a right angle. Use steady, gentle force from your normal arm to rotate the hand of the stiff shoulder out away from your body. Continue the rotation until it is straight in front of you holding it there for a count of 10. Do not go beyond this level of rotation until seen back by Dr. Tamera Punt. Repeat this exercise ten times slowly.      Please call [phone] during normal business hours or [phone] after hours for any problems. Including the following:  - excessive redness of the incisions - drainage for more than 4 days - fever of more than 101.5 F  *Please note that pain medications will not be refilled after hours or on weekends.

## 2018-11-09 NOTE — H&P (Signed)
Deborah Walter is an 58 y.o. female.   Chief Complaint: R shoulder pain and dysfunction HPI: Endstage R shoulder arthritis with significant pain and dysfunction, failed conservative measures.  Pain interferes with sleep and quality of life.  Past Medical History:  Diagnosis Date  . Acid reflux   . Arthritis    osteoarthritis  . Elevated cholesterol   . Family history of adverse reaction to anesthesia    mother- hard to wake her up  . H/O seasonal allergies   . Hypertension     Past Surgical History:  Procedure Laterality Date  . CLAVICLE SURGERY Right Early 1990  . COLONOSCOPY WITH PROPOFOL N/A 02/27/2018   Procedure: COLONOSCOPY WITH PROPOFOL;  Surgeon: Daneil Dolin, MD;  [facility];  Service: Endoscopy;  Laterality: N/A;  8:30am  . CYSTECTOMY  Early 1990   Bottom of spine  . NASAL SEPTOPLASTY W/ TURBINOPLASTY Bilateral 09/01/2015   Procedure: NASAL SEPTOPLASTY WITH TURBINATE REDUCTION;  Surgeon: Leta Baptist, MD;  Location: [facility];  Service: ENT;  Laterality: Bilateral;  . POLYPECTOMY  02/27/2018   Procedure: POLYPECTOMY;  Surgeon: Daneil Dolin, MD;  Location: [facility];  Service: Endoscopy;;  hepatic flexure, rectum  . SINOSCOPY    . SINUS ENDO WITH FUSION Left 09/01/2015   Procedure: LEFT ENDOSCOPIC TOTAL ETHMOIDECTOMY LEFT ENDOSCOPIC  MAXILLARY ANTROSTOMY LEFT ENDOSCOPIC FRONTAL RECESS EXPLORATION;  Surgeon: Leta Baptist, MD;  Location: [facility];  Service: ENT;  Laterality: Left;  . TUBAL LIGATION  1982    Family History  Problem Relation Age of Onset  . Asthma Father   . Asthma Sister   . Allergic rhinitis Neg Hx   . Eczema Neg Hx   . Immunodeficiency Neg Hx   . Urticaria Neg Hx   . Angioedema Neg Hx   . Atopy Neg Hx   . Colon cancer Neg Hx   . Colon polyps Neg Hx    Social History:  reports that she quit smoking about 4 years ago. Her smoking use included cigarettes. She has a 66.00 pack-year smoking history. She has never  used smokeless tobacco. She reports current alcohol use. She reports that she does not use drugs.  Allergies:  Allergies  Allergen Reactions  . Amoxicillin Swelling    Facial swelling and severe itching  . Atorvastatin Other (See Comments)  . Diclofenac     Caused dry mouth  . Dust Mite Extract   . Gramineae Pollens     unknown  . Other     ALMONDS.  CAUSE MIGRAINE HEADACHE unknown  . Pravastatin Other (See Comments)  . Cefuroxime Rash  . Chocolate Itching    Medications Prior to Admission  Medication Sig Dispense Refill  . Carboxymethylcellulose Sodium (THERATEARS OP) Place 1 drop into both eyes 2 (two) times daily as needed (dry eyes).    . famotidine (PEPCID) 40 MG tablet Take 40 mg by mouth at bedtime.     . fluticasone (FLONASE) 50 MCG/ACT nasal spray SHAKE LIQUID AND USE 1 SPRAY IN EACH NOSTRIL TWICE DAILY (Patient taking differently: Place 1 spray into both nostrils 2 (two) times daily as needed for allergies. ) 16 g 5  . hydrOXYzine (ATARAX/VISTARIL) 10 MG tablet TAKE 1 TABLET BY MOUTH AT BEDTIME AS NEEDED FOR ITCHING (Patient taking differently: Take 10 mg by mouth at bedtime as needed for itching. ) 90 tablet 1  . lisinopril-hydrochlorothiazide (PRINZIDE,ZESTORETIC) 20-25 MG tablet Take 1 tablet by mouth daily.    Marland Kitchen  omeprazole (PRILOSEC) 20 MG capsule Take 20 mg by mouth daily.   0  . oxyCODONE-acetaminophen (PERCOCET/ROXICET) 5-325 MG tablet Take 1 tablet by mouth every 4 (four) hours as needed for severe pain. (Patient taking differently: Take 1 tablet by mouth every 6 (six) hours as needed for moderate pain. ) 20 tablet 0  . Spacer/Aero-Holding Chambers (AEROCHAMBER PLUS WITH MASK) inhaler 1 each by Other route as needed. 1 each 2  . PROAIR HFA 108 (90 Base) MCG/ACT inhaler INHALE 2 PUFFS BY MOUTH EVERY 6 HOURS AS NEEDED FOR WHEEZING FOR SHORTNESS OF BREATH (Patient taking differently: Inhale 2 puffs into the lungs every 6 (six) hours as needed for wheezing or shortness of  breath. ) 9 g 2    No results found for this or any previous visit (from the past 48 hour(s)). No results found.  ROS  Blood pressure 130/84, pulse (!) 105, temperature 97.7 F (36.5 C), temperature source Oral, resp. rate 17, height 5\' 9"  (1.753 m), weight 99.3 kg, SpO2 98 %. Physical Exam   Assessment/Plan R shoulder endstage OA Plan R TSA Risks / benefits of surgery discussed Consent on chart  NPO for OR Preop antibiotics   Isabella Stalling, MD 11/09/2018, 7:21 AM

## 2018-11-09 NOTE — Anesthesia Procedure Notes (Signed)
Procedure Name: Intubation Performed by: Gean Maidens, CRNA Pre-anesthesia Checklist: Patient identified, Emergency Drugs available, Suction available, Patient being monitored and Timeout performed Patient Re-evaluated:Patient Re-evaluated prior to induction Oxygen Delivery Method: Circle system utilized Preoxygenation: Pre-oxygenation with 100% oxygen Induction Type: IV induction Ventilation: Mask ventilation without difficulty Laryngoscope Size: Mac and 4 Grade View: Grade I Tube type: Oral Tube size: 7.0 mm Number of attempts: 1 Airway Equipment and Method: Stylet Secured at: 21 cm Tube secured with: Tape Dental Injury: Teeth and Oropharynx as per pre-operative assessment

## 2018-11-09 NOTE — Anesthesia Preprocedure Evaluation (Addendum)
Anesthesia Evaluation  Patient identified by MRN, date of birth, ID band Patient awake    Reviewed: Allergy & Precautions, NPO status , Patient's Chart, lab work & pertinent test results  History of Anesthesia Complications Negative for: history of anesthetic complications  Airway Mallampati: II  TM Distance: >3 FB Neck ROM: Full    Dental  (+) Dental Advisory Given   Pulmonary neg sleep apnea, neg recent URI, former smoker,    breath sounds clear to auscultation       Cardiovascular hypertension, Pt. on medications  Rhythm:Regular     Neuro/Psych negative neurological ROS  negative psych ROS   GI/Hepatic GERD  Medicated,  Endo/Other    Renal/GU      Musculoskeletal  (+) Arthritis ,   Abdominal   Peds  Hematology   Anesthesia Other Findings   Reproductive/Obstetrics                            Anesthesia Physical Anesthesia Plan  ASA: II  Anesthesia Plan: General and Regional   Post-op Pain Management:  Regional for Post-op pain   Induction: Intravenous  PONV Risk Score and Plan: 3 and Ondansetron and Dexamethasone  Airway Management Planned: Oral ETT  Additional Equipment: None  Intra-op Plan:   Post-operative Plan:   Informed Consent: I have reviewed the patients History and Physical, chart, labs and discussed the procedure including the risks, benefits and alternatives for the proposed anesthesia with the patient or authorized representative who has indicated his/her understanding and acceptance.     Dental advisory given  Plan Discussed with: CRNA and Surgeon  Anesthesia Plan Comments:         Anesthesia Quick Evaluation

## 2018-11-09 NOTE — Anesthesia Procedure Notes (Signed)
Anesthesia Regional Block: Axillary brachial plexus block   Pre-Anesthetic Checklist: ,, timeout performed, Correct Patient, Correct Site, Correct Laterality, Correct Procedure, Correct Position, site marked, Risks and benefits discussed,  Surgical consent,  Pre-op evaluation,  At surgeon's request and post-op pain management  Laterality: Right and Upper  Prep: chloraprep       Needles:  Injection technique: Single-shot     Needle Length: 5cm  Needle Gauge: 22     Additional Needles: Arrow StimuQuik ECHO Echogenic Stimulating PNB Needle  Procedures:,,,, ultrasound used (permanent image in chart),,,,  Narrative:  Start time: 11/09/2018 7:19 AM End time: 11/09/2018 7:23 AM Injection made incrementally with aspirations every 5 mL.  Performed by: Personally  Anesthesiologist: Oleta Mouse, MD

## 2018-11-09 NOTE — Op Note (Signed)
Procedure(s): TOTAL SHOULDER ARTHROPLASTY Procedure Note  Deborah Walter female 58 y.o. 11/09/2018  Preop DX: R  Shoulder endstage osteoarthritis  Post op dx: Same R long head biceps tendonopathy  Procedure(s) and Anesthesia Type:    R TOTAL SHOULDER ARTHROPLASTY - Choice    R long head biceps tenodesis  Surgeon(s) and Role:    [name], MD - Primary   Indications:  58 y.o. female  With endstage right shoulder arthritis. Pain and dysfunction interfered with quality of life and nonoperative treatment with activity modification, NSAIDS and injections failed.     Surgeon: Isabella Stalling   Assistants: Jeanmarie Hubert PA-C [facility] was present and scrubbed throughout the procedure and was essential in positioning, retraction, exposure, and closure)  Anesthesia: General endotracheal anesthesia with preoperative interscalene block given by the attending anesthesiologist    Procedure Detail  TOTAL SHOULDER ARTHROPLASTY  Findings: Tornier flex anatomic press-fit size 2 stem with a 48 head, cemented size 40s Cortiloc glenoid.   A lesser tuberosity osteotomy was performed and repaired at the conclusion of the procedure.  Estimated Blood Loss:  200 mL         Drains: None   Blood Given: none          Specimens: none        Complications:  * No complications entered in OR log *         Disposition: PACU - hemodynamically stable.         Condition: stable    Procedure:   The patient was identified in the preoperative holding area where I personally marked the operative extremity after verifying with the patient and consent. She  was taken to the operating room where She was transferred to the   operative table.  The patient received an interscalene block in   the holding area by the attending anesthesiologist.  General anesthesia was induced   in the operating room without complication.  The patient did receive IV  Ancef prior to the commencement of the  procedure.  The patient was   placed in the beach-chair position with the back raised about 30   degrees.  The nonoperative extremity and head and neck were carefully   positioned and padded protecting against neurovascular compromise.  The   left upper extremity was then prepped and draped in the standard sterile   fashion.    The appropriate operative time-out was performed with   Anesthesia, the perioperative staff, as well as myself and we all agreed   that the right side was the correct operative site.  An approximately   10 cm incision was made from the tip of the coracoid to the center point of the   humerus at the level of the axilla.  Dissection was carried down sharply   through subcutaneous tissues and cephalic vein was identified and taken   laterally with the deltoid.  The pectoralis major was taken medially.  The   upper 1 cm of the pectoralis major was released from its attachment on   the humerus.  The clavipectoral fascia was incised just lateral to the   conjoined tendon.  This incision was carried up to but not into the   coracoacromial ligament.  Digital palpation was used to prove   integrity of the axillary nerve which was protected throughout the   procedure.  Musculocutaneous nerve was not palpated in the operative   field.  Conjoined tendon was then retracted gently medially and the  deltoid laterally.  Anterior circumflex humeral vessels were clamped and   coagulated.  The soft tissues overlying the biceps was incised and this   incision was carried across the transverse humeral ligament to the base   of the coracoid.  The biceps was noted to be severely degenerated. It was released from the superior labrum. The biceps was then tenodesed to the soft tissue just above   pectoralis major and the remaining portion of the biceps superiorly was   excised.  An osteotomy was performed at the lesser tuberosity.  The capsule was then   released all the way down to the 6  o'clock position of the humeral head.   The humeral head was then delivered with simultaneous adduction,   extension and external rotation.  All humeral osteophytes were removed   and the anatomic neck of the humerus was marked and cut free hand at   approximately 25 degrees retroversion within about 3 mm of the cuff   reflection posteriorly.  The head size was estimated to be a 48 medium   offset.  At that point, the humeral head was retracted posteriorly with   a Fukuda retractor.   Remaining portion of the capsule was released at the base of the   coracoid.  The remaining biceps anchor and the entire anterior-inferior   labrum was excised.  The posterior labrum was also excised but the   posterior capsule was not released.  The guidepin was placed bicortically with non elevated guide.  The reamer was used to ream to concentric bone with punctate bleeding.  This gave an excellent concentric surface.  The center hole was then drilled for an anchor peg glenoid followed by the three peripheral holes and none of the holes   exited the glenoid wall.  I then pulse irrigated these holes and dried   them with Surgicel.  The three peripheral holes were then   pressurized cemented and the anchor peg glenoid was placed and impacted   with an excellent fit.  The glenoid was a 40 small component.  The proximal humerus was then again exposed taking care not to displace the glenoid.    The entry awl was used followed by sounding reamers and then sequentially broached from size 1-2. This was then left in place and the calcar planer was used. Trial head was placed with a 48.  With the trial implantation of the component,  there was approximately 50% posterior translation with immediate snap back to the   anatomic position.  With forward elevation, there was no tendency   towards posterior subluxation.   The trial was removed and the final implant was prepared on a back table.  The trial was removed and the final  implant was prepared on a back table.   3 small holes were drilled on the medial side of the lesser tuberosity osteotomy, through which 2 labral tapes were passed. The implant was then placed through the loop of the 2 labral tapes and impacted with an excellent press-fit. This achieved excellent anatomic reconstruction of the proximal humerus.  The joint was then copiously irrigated with pulse lavage.  The subscapularis and   lesser tuberosity osteotomy were then repaired using the 2 labral tapes previously passed in a double row fashion with horizontal mattress sutures medially brought over through bone tunnels tied over a bone bridge laterally.   One #1 Ethibond was placed at the rotator interval just above   the lesser tuberosity. Copious irrigation  was used. Skin was closed with 2-0 Vicryl sutures in the deep dermal layer and 4-0 Monocryl in a subcuticular  running fashion.  Sterile dressings were then applied including Aquacel.  The patient was placed in a sling and allowed to awaken from general anesthesia and taken to the recovery room in stable condition.      POSTOPERATIVE PLAN:  Early passive range of motion will be allowed with the goal of 0 degrees external rotation and 90 degrees forward elevation.  No internal rotation at this time.  No active motion of the arm until the lesser tuberosity heals.  The patient will likely be kept in the hospital for 1-2 days and then discharged home.

## 2018-11-10 ENCOUNTER — Encounter (HOSPITAL_COMMUNITY): Payer: Self-pay | Admitting: Orthopedic Surgery

## 2018-11-10 LAB — CBC
HCT: 37.7 % (ref 36.0–46.0)
Hemoglobin: 11.9 g/dL — ABNORMAL LOW (ref 12.0–15.0)
MCH: 30.2 pg (ref 26.0–34.0)
MCHC: 31.6 g/dL (ref 30.0–36.0)
MCV: 95.7 fL (ref 80.0–100.0)
Platelets: 239 10*3/uL (ref 150–400)
RBC: 3.94 MIL/uL (ref 3.87–5.11)
RDW: 14.4 % (ref 11.5–15.5)
WBC: 11.8 10*3/uL — ABNORMAL HIGH (ref 4.0–10.5)
nRBC: 0 % (ref 0.0–0.2)

## 2018-11-10 LAB — BASIC METABOLIC PANEL
Anion gap: 10 (ref 5–15)
BUN: 20 mg/dL (ref 6–20)
CO2: 25 mmol/L (ref 22–32)
Calcium: 9.4 mg/dL (ref 8.9–10.3)
Chloride: 105 mmol/L (ref 98–111)
Creatinine, Ser: 0.9 mg/dL (ref 0.44–1.00)
GFR calc Af Amer: >60 mL/min (ref >60)
GFR calc non Af Amer: >60 mL/min (ref >60)
Glucose, Bld: 135 mg/dL — ABNORMAL HIGH (ref 70–99)
Potassium: 5.3 mmol/L — ABNORMAL HIGH (ref 3.5–5.1)
Sodium: 140 mmol/L (ref 135–145)

## 2018-11-10 MED ORDER — TIZANIDINE HCL 4 MG PO TABS: 4.0000 mg | ORAL_TABLET | Freq: Three times a day (TID) | ORAL | 1 refills | Status: DC | PRN

## 2018-11-10 MED ORDER — OXYCODONE-ACETAMINOPHEN 5-325 MG PO TABS: ORAL_TABLET | ORAL | 0 refills | Status: AC

## 2018-11-10 NOTE — Evaluation (Signed)
Occupational Therapy Evaluation Patient Details Name: Deborah Walter MRN: [id number] DOB: 19-Jan-1961 Today's Date: 11/10/2018    History of Present Illness s/p R TSA   Clinical Impression   58 year old female admitted for the above sx.  Reviewed education and educated on sling; did not perform exercises or UB adls at this time due to breakfast arriving.  Pt plans home alone, but ex husband lives nearby and will help as needed. Pt is very independent natured and wants to do for herself. She has a tub and put a stick on grab bar on; but educated that this will not help her as she can only use it with LUE and also wash with LUE.  Pt doesn't have a seat and will sponge bathe initially.  Will see once more prior to d/c    Follow Up Recommendations  Follow surgeon's recommendation for DC plan and follow-up therapies    Equipment Recommendations  None recommended by OT    Recommendations for Other Services       Precautions / Restrictions Precautions Precautions: Shoulder Type of Shoulder Precautions: sling on except for bathing, dressing and exercise.  PROM allowed for FF 0-90 and ER to neutral NO ABD, NO PENDULUMS, may move elbow wrist and fingers Shoulder Interventions: Shoulder sling/immobilizer Precaution Booklet Issued: Yes (comment) Restrictions RUE Weight Bearing: Non weight bearing      Mobility Bed Mobility Overal bed mobility: Independent                Transfers                      Balance                                           ADL either performed or assessed with clinical judgement   ADL Overall ADL's : Needs assistance/impaired         Upper Body Bathing: Minimal assistance   Lower Body Bathing: Independent   Upper Body Dressing : Minimal assistance   Lower Body Dressing: Independent                 General ADL Comments: reviewed protocol and exercises, ice machine and repositioned sling. Breakfast arrived  during session     Vision         Perception     Praxis      Pertinent Vitals/Pain Pain Assessment: Faces Faces Pain Scale: Hurts little more Pain Location: back of R arm/scapula Pain Descriptors / Indicators: Sore Pain Intervention(s): Limited activity within patient's tolerance;Monitored during session;Repositioned;Ice applied     Hand Dominance     Extremity/Trunk Assessment Upper Extremity Assessment Upper Extremity Assessment: RUE deficits/detail(waking up, able to move hand; edema present)           Communication Communication Communication: No difficulties   Cognition Arousal/Alertness: Awake/alert Behavior During Therapy: WFL for tasks assessed/performed Overall Cognitive Status: Within Functional Limits for tasks assessed                                     General Comments       Exercises     Shoulder Instructions      Home Living Family/patient expects to be discharged to:: Private residence Living Arrangements: Alone Available Help at Discharge: Family  Bathroom Shower/Tub: Occupational psychologist: None          Prior Functioning/Environment Level of Independence: Independent                 OT Problem List: Impaired UE functional use;Decreased strength;Decreased knowledge of precautions;Pain;Increased edema      OT Treatment/Interventions: Self-care/ADL training;DME and/or AE instruction;Patient/family education;Therapeutic exercise    OT Goals(Current goals can be found in the care plan section) Acute Rehab OT Goals Patient Stated Goal: return to independence OT Goal Formulation: With patient Time For Goal Achievement: 11/11/18 Potential to Achieve Goals: Good ADL Goals Additional ADL Goal #1: pt will be independent with allowed exercises Additional ADL Goal #2: pt will verbalize precautions and be independent with sling application  OT Frequency:  Min 2X/week   Barriers to D/C:            Co-evaluation              AM-PAC OT "6 Clicks" Daily Activity     Outcome Measure Help from another person eating meals?: A Little Help from another person taking care of personal grooming?: A Little Help from another person toileting, which includes using toliet, bedpan, or urinal?: A Little Help from another person bathing (including washing, rinsing, drying)?: A Little Help from another person to put on and taking off regular upper body clothing?: A Little Help from another person to put on and taking off regular lower body clothing?: A Little 6 Click Score: 18   End of Session    Activity Tolerance: Patient tolerated treatment well Patient left: in bed;with call bell/phone within reach  OT Visit Diagnosis: Pain Pain - Right/Left: Right Pain - part of body: Shoulder                Time: [id number] OT Time Calculation (min): 17 min Charges:  OT General Charges $OT Visit: 1 Visit OT Evaluation $OT Eval Low Complexity: 1 Low  Lesle Chris, OTR/L Acute Rehabilitation Services [phone] [facility] pager [phone] office 11/10/2018  [place] 11/10/2018, 9:48 AM

## 2018-11-10 NOTE — Progress Notes (Signed)
   PATIENT ID: Deborah Walter   1 Day Post-Op Procedure(s) (LRB): TOTAL SHOULDER ARTHROPLASTY (Right)  Subjective: Doing well.  She had some posterior discomfort last night with feeling better now.  Objective:  Vitals:   11/10/18 0145 11/10/18 0533  BP: 121/73 104/71  Pulse: 69 68  Resp: 18 18  Temp: 97.7 F (36.5 C) 97.7 F (36.5 C)  SpO2: 99% 100%     Right shoulder dressing clean dry and intact.  Sling is intact.  Labs:  Recent Labs    11/10/18 0305  HGB 11.9*   Recent Labs    11/10/18 0305  WBC 11.8*  RBC 3.94  HCT 37.7  PLT 239   Recent Labs    11/10/18 0305  NA 140  K 5.3*  CL 105  CO2 25  BUN 20  CREATININE 0.90  GLUCOSE 135*  CALCIUM 9.4    Assessment and Plan:Postop day 1 status post right total shoulder She will see a therapist prior to discharge today. Follow-up with me in 10-14 days  VTE proph: Aspirin

## 2018-11-10 NOTE — Progress Notes (Signed)
   11/10/18 0947  OT Visit Information  Last OT Received On 11/10/18  Assistance Needed +1  History of Present Illness s/p R TSA  Precautions  Precautions Shoulder  Type of Shoulder Precautions sling on except for bathing, dressing and exercise.  PROM allowed for FF 0-90 and ER to neutral NO ABD, NO PENDULUMS, may move elbow wrist and fingers  Shoulder Interventions Shoulder sling/immobilizer  Precaution Booklet Issued Yes (comment)  Pain Assessment  Pain Assessment Faces  Faces Pain Scale 4  Pain Location back of R arm/scapula  Pain Descriptors / Indicators Sore  Pain Intervention(s) Limited activity within patient's tolerance;Monitored during session;Premedicated before session;Repositioned;Ice applied  Cognition  Arousal/Alertness Awake/alert  Behavior During Therapy WFL for tasks assessed/performed  Overall Cognitive Status Within Functional Limits for tasks assessed  ADL  Grooming Oral care;Independent  Upper Body Dressing  Minimal assistance  Lower Body Dressing Independent  Toilet Transfer Independent  General ADL Comments Pt almost able to don shirt independently; prefastened sling so she can get this on herself, as velcro is very hard to unsecure.  Pt plans to lay lie cuff on her without straps when she uses it.  Performed PROM of FF to 80 and subneutral ER (-20) supine.    Bed Mobility  Overal bed mobility Independent  Restrictions  RUE Weight Bearing NWB  Transfers  Overall transfer level Independent  Exercises  Exercises Other exercises  Other Exercises  Other Exercises performed allowed exercises. Pt still without movement in most of arm; educated on elbow and wrist. Encouraged frequent ROM as she has edema in fingers and forearm  OT - End of Session  Activity Tolerance Patient tolerated treatment well  Patient left in bed;with call bell/phone within reach  OT Assessment/Plan  OT Visit Diagnosis Pain  Pain - Right/Left Right  Pain - part of body Shoulder   Follow Up Recommendations Follow surgeon's recommendation for DC plan and follow-up therapies  OT Equipment None recommended by OT  AM-PAC OT "6 Clicks" Daily Activity Outcome Measure (Version 2)  Help from another person eating meals? 3  Help from another person taking care of personal grooming? 4  Help from another person toileting, which includes using toliet, bedpan, or urinal? 4  Help from another person bathing (including washing, rinsing, drying)? 3  Help from another person to put on and taking off regular upper body clothing? 3  Help from another person to put on and taking off regular lower body clothing? 4  6 Click Score 21  OT Goal Progression  Progress towards OT goals Goals met/education completed, patient discharged from OT  ADL Goals  Additional ADL Goal #1 pt will be independent with allowed exercises  Additional ADL Goal #2 pt will verbalize precautions and be independent with sling application  OT Time Calculation  OT Start Time (ACUTE ONLY) 0902  OT Stop Time (ACUTE ONLY) 0935  OT Time Calculation (min) 33 min  OT General Charges  $OT Visit 1 Visit  OT Treatments  $Self Care/Home Management  8-22 mins  $Therapeutic Exercise 8-22 mins  Lesle Chris, OTR/L Acute Rehabilitation Services [phone] [facility] pager [phone] office 11/10/2018

## 2018-11-10 NOTE — Plan of Care (Signed)
Patient discharged home in stable condition, she is waiting on her ride 

## 2018-11-16 NOTE — Anesthesia Postprocedure Evaluation (Signed)
Anesthesia Post Note  Patient: Deborah Walter  Procedure(s) Performed: TOTAL SHOULDER ARTHROPLASTY (Right Shoulder)     Patient location during evaluation: PACU Anesthesia Type: Regional and General Level of consciousness: awake and alert Pain management: pain level controlled Vital Signs Assessment: post-procedure vital signs reviewed and stable Respiratory status: spontaneous breathing, nonlabored ventilation, respiratory function stable and patient connected to nasal cannula oxygen Cardiovascular status: blood pressure returned to baseline and stable Postop Assessment: no apparent nausea or vomiting Anesthetic complications: no    Last Vitals:  Vitals:   11/10/18 0533 11/10/18 0959  BP: 104/71 (!) 124/58  Pulse: 68 82  Resp: 18 12  Temp: 36.5 C 36.5 C  SpO2: 100% 94%    Last Pain:  Vitals:   11/10/18 0715  TempSrc:   PainSc: 6                   

## 2018-11-16 NOTE — Discharge Summary (Signed)
Patient ID: [name] MRN: [id number] DOB/AGE: 06-12-1960 58 y.o.  Admit date: 11/09/2018 Discharge date: 11/10/2018   Admission Diagnoses:  Active Problems:   Status post total shoulder arthroplasty, right   Discharge Diagnoses:  Same  Past Medical History:  Diagnosis Date  . Acid reflux   . Arthritis    osteoarthritis  . Elevated cholesterol   . Family history of adverse reaction to anesthesia    mother- hard to wake her up  . H/O seasonal allergies   . Hypertension     Surgeries: Procedure(s): TOTAL SHOULDER ARTHROPLASTY on 11/09/2018   Consultants:   Discharged Condition: Improved  Hospital Course: Deborah Walter is an 58 y.o. female who was admitted 11/09/2018 for operative treatment of right shoulder end stage OA. Patient has severe unremitting pain that affects sleep, daily activities, and work/hobbies. After pre-op clearance the patient was taken to the operating room on 11/09/2018 and underwent  Procedure(s): TOTAL SHOULDER ARTHROPLASTY.    Patient was given perioperative antibiotics:  Anti-infectives (From admission, onward)   Start     Dose/Rate Route Frequency Ordered Stop   11/09/18 1100  clindamycin (CLEOCIN) IVPB 600 mg     600 mg 100 mL/hr over 30 Minutes Intravenous Every 6 hours 11/09/18 1054 11/10/18 0002   11/09/18 0600  vancomycin (VANCOCIN) IVPB 1000 mg/200 mL premix     1,000 mg 200 mL/hr over 60 Minutes Intravenous On call to O.R. 11/09/18 [id number] 11/09/18 0737       Patient was given sequential compression devices, early ambulation, and chemoprophylaxis to prevent DVT.  Patient benefited maximally from hospital stay and there were no complications.    Recent vital signs: No data found.   Recent laboratory studies: No results for input(s): WBC, HGB, HCT, PLT, NA, K, CL, CO2, BUN, CREATININE, GLUCOSE, INR, CALCIUM in the last 72 hours.  Invalid input(s): PT, 2   Discharge Medications:   Allergies as of 11/10/2018      Reactions   Amoxicillin Swelling   Facial swelling and severe itching   Atorvastatin Other (See Comments)   Diclofenac    Caused dry mouth   Dust Mite Extract    Gramineae Pollens    unknown   Other    ALMONDS.  CAUSE MIGRAINE HEADACHE unknown   Penicillins Swelling   Facial swelling and severe itching   Pravastatin Other (See Comments)   Cefuroxime Rash      Medication List    TAKE these medications   aerochamber plus with mask inhaler 1 each by Other route as needed.   famotidine 40 MG tablet Commonly known as: PEPCID Take 40 mg by mouth at bedtime.   fluticasone 50 MCG/ACT nasal spray Commonly known as: FLONASE SHAKE LIQUID AND USE 1 SPRAY IN EACH NOSTRIL TWICE DAILY What changed: See the new instructions.   hydrOXYzine 10 MG tablet Commonly known as: ATARAX/VISTARIL TAKE 1 TABLET BY MOUTH AT BEDTIME AS NEEDED FOR ITCHING What changed: See the new instructions.   lisinopril-hydrochlorothiazide 20-25 MG tablet Commonly known as: ZESTORETIC Take 1 tablet by mouth daily.   omeprazole 20 MG capsule Commonly known as: PRILOSEC Take 20 mg by mouth daily.   oxyCODONE-acetaminophen 5-325 MG tablet Commonly known as: Percocet Take 1-2 tablets every 4 hours as needed for post operative pain. MAX 6/day What changed:   how much to take  how to take this  when to take this  reasons to take this  additional instructions   ProAir HFA 108 (90 Base) MCG/ACT inhaler  Generic drug: albuterol INHALE 2 PUFFS BY MOUTH EVERY 6 HOURS AS NEEDED FOR WHEEZING FOR SHORTNESS OF BREATH What changed: See the new instructions.   THERATEARS OP Place 1 drop into both eyes 2 (two) times daily as needed (dry eyes).   tiZANidine 4 MG tablet Commonly known as: Zanaflex Take 1 tablet (4 mg total) by mouth every 8 (eight) hours as needed for muscle spasms.       Diagnostic Studies: Dg Shoulder Right Port  Result Date: 11/09/2018 CLINICAL DATA:  S/p right shoulder surgery today, image done  in recovery EXAM: PORTABLE RIGHT SHOULDER COMPARISON:  Two-view chest on 09/03/2010 FINDINGS: Status post RIGHT shoulder arthroplasty. Hardware appears intact. No evidence for dislocation or fracture. Shallow lung inflation noted in the lungs. IMPRESSION: Status post RIGHT shoulder arthroplasty. Electronically Signed   By: Nolon Nations M.D.   On: 11/09/2018 10:02    Disposition:   Discharge Instructions    Call MD / Call 911   Complete by: As directed    If you experience chest pain or shortness of breath, CALL 911 and be transported to the hospital emergency room.  If you develope a fever above 101 F, pus (white drainage) or increased drainage or redness at the wound, or calf pain, call your surgeon's office.   Constipation Prevention   Complete by: As directed    Drink plenty of fluids.  Prune juice may be helpful.  You may use a stool softener, such as Colace (over the counter) 100 mg twice a day.  Use MiraLax (over the counter) for constipation as needed.   Diet - low sodium heart healthy   Complete by: As directed    Increase activity slowly as tolerated   Complete by: As directed       Follow-up Information    [name], MD. Schedule an appointment as soon as possible for a visit in 2 weeks.   Specialty: Orthopedic Surgery Contact information: [place] [id number] [phone]            Signed: [name] 11/16/2018, 9:07 AM

## 2018-11-20 ENCOUNTER — Other Ambulatory Visit: Payer: Self-pay | Admitting: Nurse Practitioner

## 2018-11-20 DIAGNOSIS — M545 Low back pain, unspecified: Secondary | ICD-10-CM

## 2018-12-15 ENCOUNTER — Other Ambulatory Visit: Payer: BC Managed Care – PPO

## 2019-01-17 ENCOUNTER — Telehealth: Payer: Self-pay | Admitting: Allergy & Immunology

## 2019-01-17 NOTE — Telephone Encounter (Signed)
Patient called stating that she has had a bad cough for about a week. Patient states that today is the first day that it felt like it was breaking up in her chest. Says it's only bad during the day, it doesn't bother her at night. Patient doesn't feel bad and she doesn't have a temperature which makes her believe it is allergy related. Patient has tried Claritin but that makes her feel bad.   Uses [facility] on [address].  Please advise.

## 2019-01-17 NOTE — Telephone Encounter (Signed)
Dr. Ernst Bowler please advise and thank you.

## 2019-01-18 MED ORDER — PREDNISONE 10 MG PO TABS: ORAL_TABLET | ORAL | 0 refills | Status: DC

## 2019-01-18 NOTE — Telephone Encounter (Signed)
Sent in short burst of prednisone. Patient notified via text.   Salvatore Marvel, MD [facility] [place]

## 2019-01-22 NOTE — Telephone Encounter (Signed)
Called and spoke with patient and she stated that she is on her 3rd day of Prednisone and  that her cough is a little bit better. Advised to patient to call back if she needs anything further. Patient verbalized understanding.

## 2019-01-23 NOTE — Telephone Encounter (Signed)
Great!  Thank you for checking in on her.   Salvatore Marvel, MD [facility] [place]

## 2019-03-07 ENCOUNTER — Other Ambulatory Visit: Payer: Self-pay | Admitting: *Deleted

## 2019-03-07 MED ORDER — FLUTICASONE PROPIONATE 50 MCG/ACT NA SUSP: 1.0000 | Freq: Two times a day (BID) | NASAL | 0 refills | Status: DC | PRN

## 2019-03-08 ENCOUNTER — Ambulatory Visit: Payer: BC Managed Care – PPO | Admitting: Allergy & Immunology

## 2019-03-14 ENCOUNTER — Ambulatory Visit: Payer: BC Managed Care – PPO | Admitting: Allergy & Immunology

## 2019-03-14 ENCOUNTER — Encounter: Payer: Self-pay | Admitting: Allergy & Immunology

## 2019-03-14 ENCOUNTER — Other Ambulatory Visit: Payer: Self-pay

## 2019-03-14 VITALS — BP 128/70 | HR 84 | Temp 97.7°F | Resp 20 | Ht 68.8 in | Wt 237.2 lb

## 2019-03-14 DIAGNOSIS — J3089 Other allergic rhinitis: Secondary | ICD-10-CM

## 2019-03-14 DIAGNOSIS — J302 Other seasonal allergic rhinitis: Secondary | ICD-10-CM | POA: Diagnosis not present

## 2019-03-14 DIAGNOSIS — R0602 Shortness of breath: Secondary | ICD-10-CM

## 2019-03-14 MED ORDER — PROAIR HFA 108 (90 BASE) MCG/ACT IN AERS: INHALATION_SPRAY | RESPIRATORY_TRACT | 2 refills | Status: AC

## 2019-03-14 MED ORDER — HYDROXYZINE HCL 10 MG PO TABS: ORAL_TABLET | ORAL | 1 refills | Status: DC

## 2019-03-14 NOTE — Patient Instructions (Addendum)
1.  Seasonal and perennial allergic rhinitis (ragweed, [place] grass, molds, dust mite) - Continue with fluticasone one spray per nostril twice daily. - Continue with Allegra one tablet daily.   2. Pruritus - Continue with hydroxyzine as needed at night. - Refill for hydroxyzine provided.   3. Shortness of breath with cough - We did not do lung testing since the device is broken. - Refill provided for the albuterol.  - Daily controller medication(s): NONE - Prior to physical activity: ProAir 2 puffs 10-15 minutes before physical activity. - Rescue medications: ProAir 4 puffs every 4-6 hours as needed  - Changes during respiratory infections or worsening symptoms: Add on Qvar 80 mcg 2 puffs twice daily for TWO WEEKS. - Asthma control goals:  * Full participation in all desired activities (may need albuterol before activity) * Albuterol use two time or less a week on average (not counting use with activity) * Cough interfering with sleep two time or less a month * Oral steroids no more than once a year * No hospitalizations  4. Return in about 6 months (around 09/11/2019). This can be an in-person, a virtual Webex or a telephone follow up visit.   Please inform us of any Emergency Department visits, hospitalizations, or changes in symptoms. Call us before going to the ED for breathing or allergy symptoms since we might be able to fit you in for a sick visit. Feel free to contact us anytime with any questions, problems, or concerns.  It was a pleasure to see you again today!  Websites that have reliable patient information: 1. American Academy of Asthma, Allergy, and Immunology: www.aaaai.org 2. Food Allergy Research and Education (FARE): foodallergy.org 3. Mothers of Asthmatics: http://www.asthmacommunitynetwork.org 4. American College of Allergy, Asthma, and Immunology: www.acaai.org   COVID-19 Vaccine Information can be found at:  ShippingScam.co.uk For questions related to vaccine distribution or appointments, please email vaccine@[facility] .com or call [phone].     "Like" [place] on Facebook and Instagram for our latest updates!        Make sure you are registered to vote! If you have moved or changed any of your contact information, you will need to get this updated before voting!  In some cases, you MAY be able to register to vote online: [email or web address]
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
## 2019-04-09 ENCOUNTER — Other Ambulatory Visit: Payer: Self-pay | Admitting: *Deleted

## 2019-04-09 MED ORDER — FLUTICASONE PROPIONATE 50 MCG/ACT NA SUSP: 1.0000 | Freq: Two times a day (BID) | NASAL | 5 refills | Status: DC | PRN

## 2019-06-29 ENCOUNTER — Other Ambulatory Visit: Payer: Self-pay | Admitting: Allergy & Immunology

## 2019-10-14 ENCOUNTER — Other Ambulatory Visit: Payer: Self-pay | Admitting: Allergy & Immunology

## 2019-10-17 MED ORDER — HYDROXYZINE HCL 10 MG PO TABS: 10.0000 mg | ORAL_TABLET | Freq: Three times a day (TID) | ORAL | 2 refills | Status: AC | PRN

## 2019-10-17 NOTE — Addendum Note (Signed)
Addended by: Valentina Shaggy on: 10/17/2019 12:53 PM   Modules accepted: Orders

## 2019-10-17 NOTE — Telephone Encounter (Signed)
I am okay with refilling that.  I have sent in the prescription.  Can you call and let the patient know and please tell her I said hello.  Salvatore Marvel, MD [facility]

## 2019-10-17 NOTE — Telephone Encounter (Signed)
Patient informed and says hello!   Thanks

## 2019-10-17 NOTE — Telephone Encounter (Signed)
Dr Ernst Bowler can you sending in this prescription? Patient is needing enough till the end of the year. Patient is hoping she will have insurance at the first of the year.  [facility]   Thanks

## 2020-08-30 IMAGING — US SOFT TISSUE ULTRASOUND HEAD/NECK
1 series · 9 of 9 positions shown · non-contrast
Comparison: None.

CLINICAL DATA: Painless palpable swelling midline neck inferior to
clavicle x2 weeks

EXAM:
ULTRASOUND OF HEAD/NECK SOFT TISSUES
TECHNIQUE: Ultrasound examination of the head and neck soft tissues was
performed in the area of clinical concern.

[Series 1: soft tissue ultrasound head/neck · 9 acquisitions, 9 frames shown]
[im 1/9]
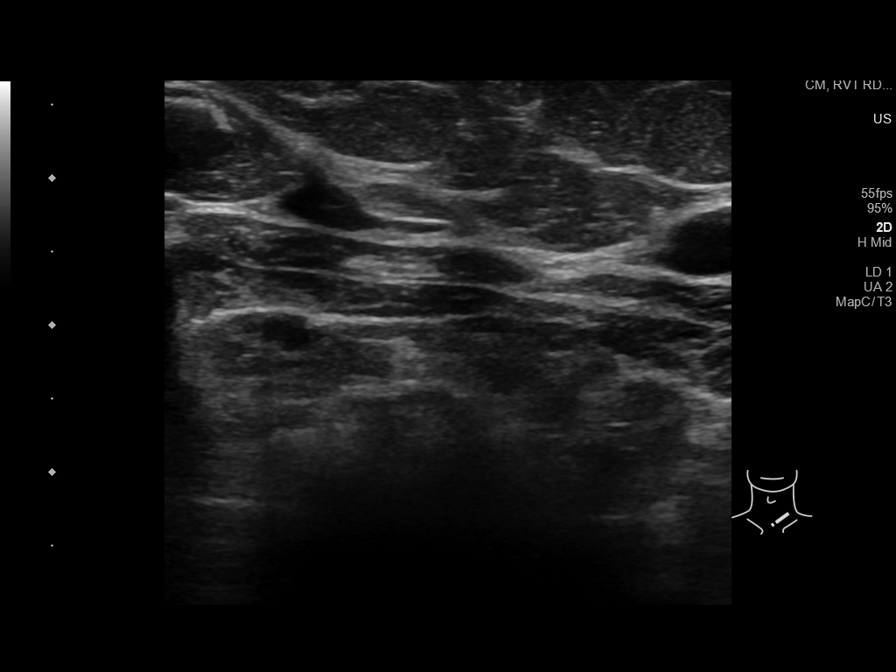
[im 2/9]
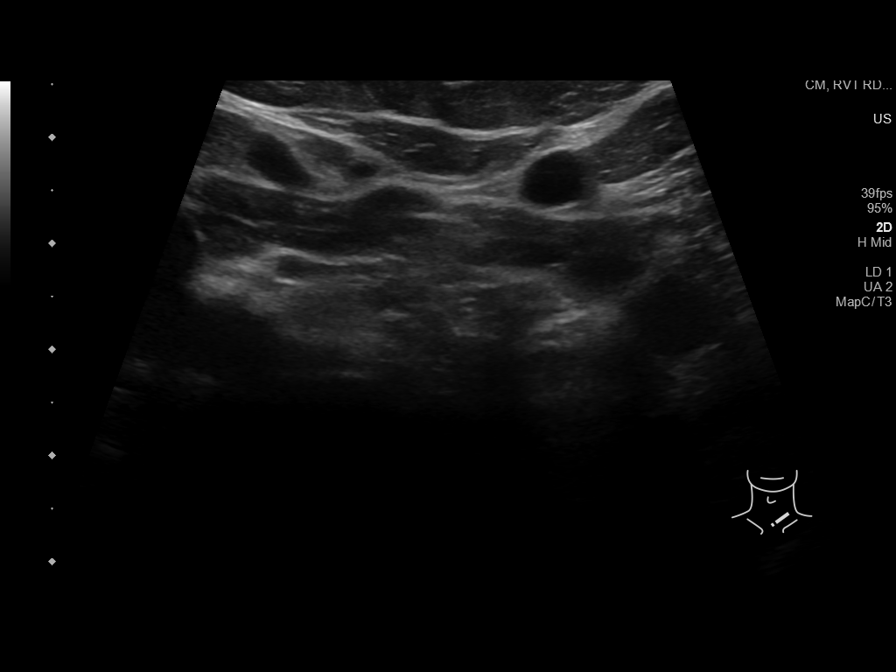
[im 3/9]
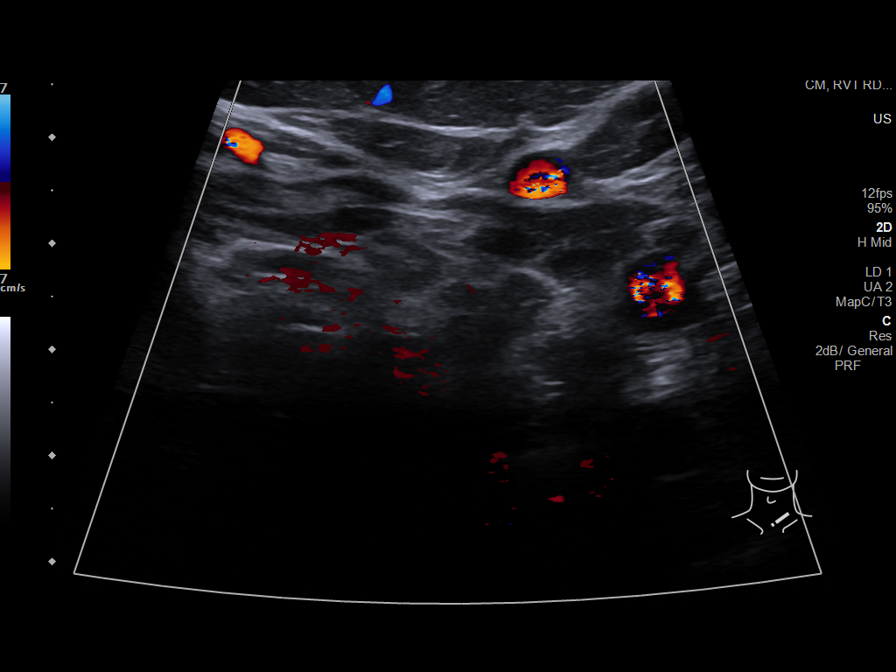
[im 4/9]
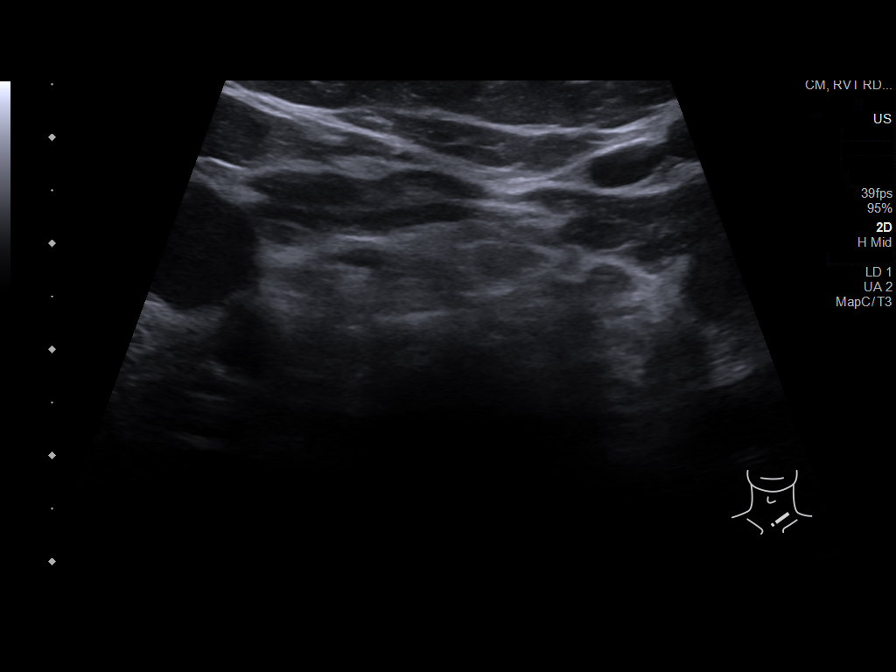
[im 5/9]
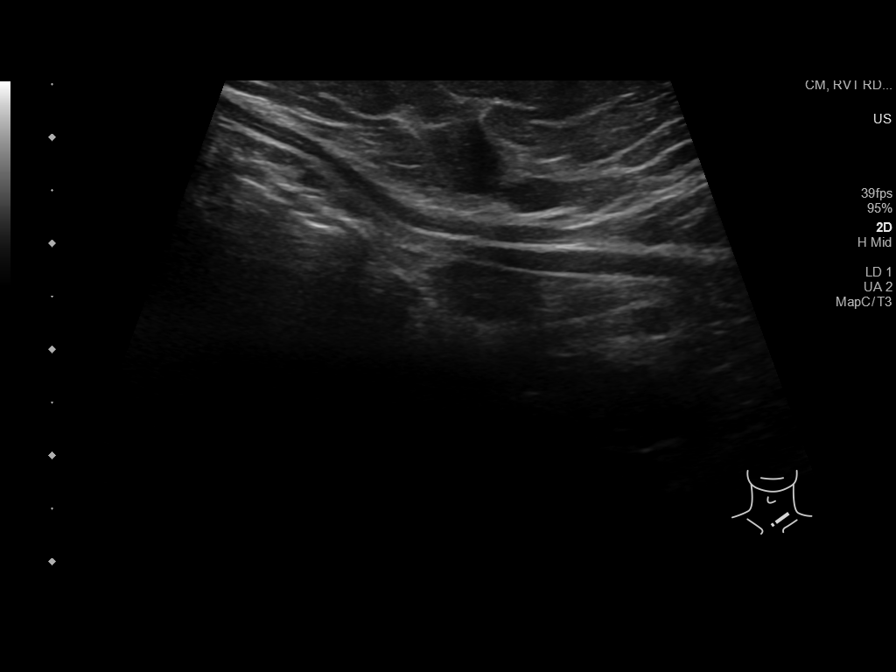
[im 6/9]
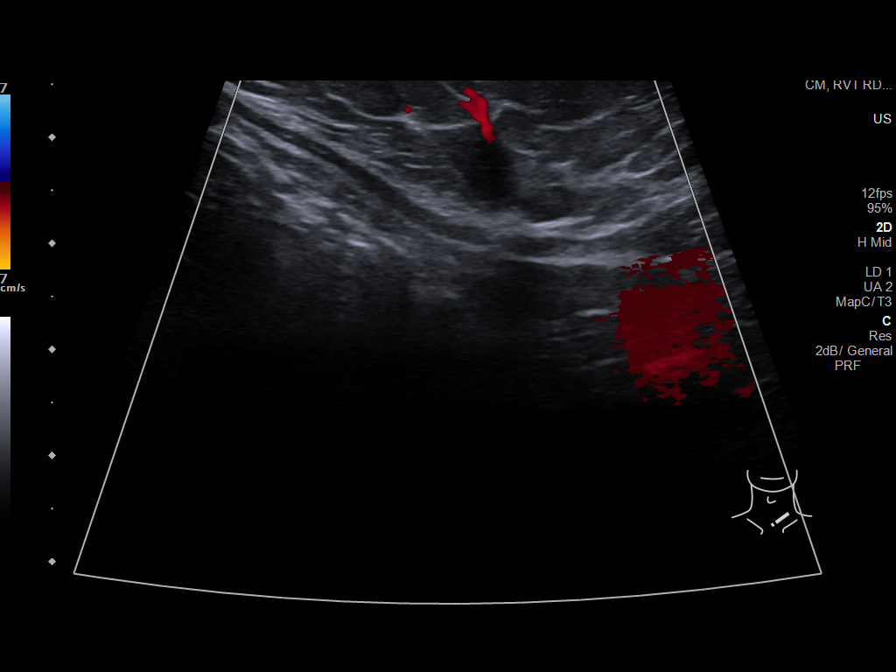
[im 7/9]
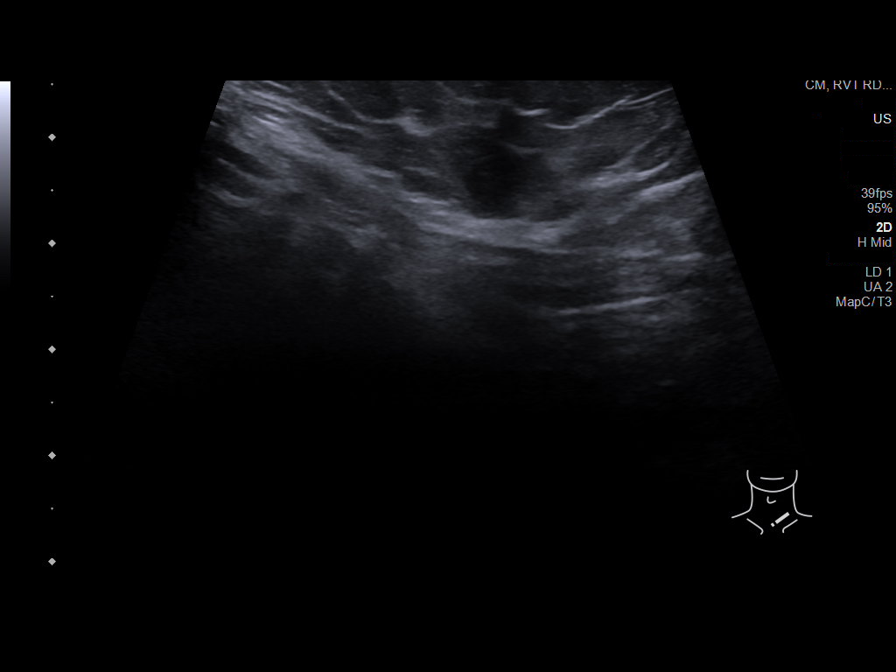
[im 8/9]
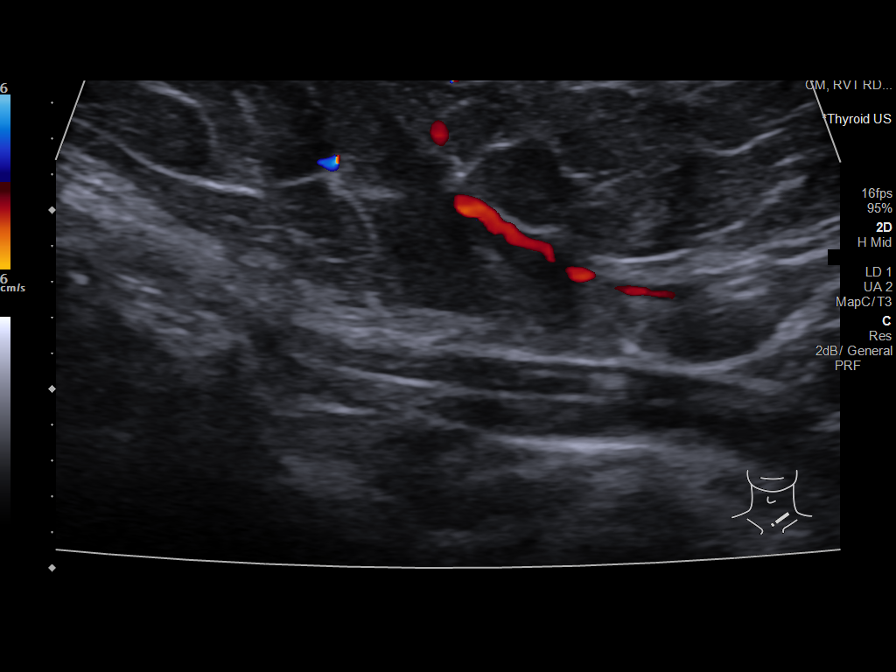
[im 9/9]
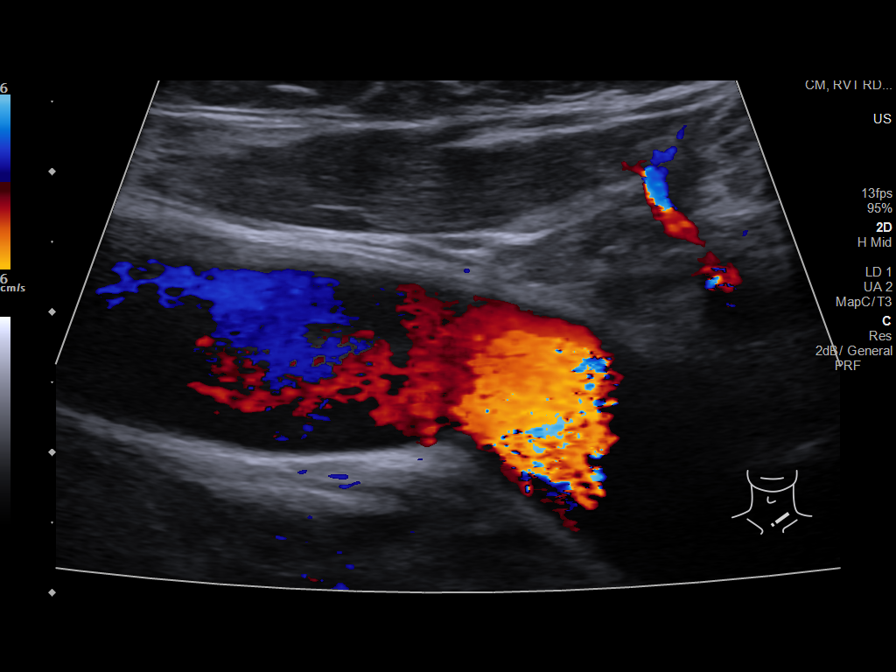

[9 of 9 positions shown; findings below may reference images not displayed]

FINDINGS: No mass, cyst, adenopathy, aneurysm, abscess, or other pathologic
findings in the region of concern.
IMPRESSION: No pathologic findings on ultrasound.

## 2020-12-16 IMAGING — DX DG SHOULDER 2+V PORT*R*
1 series · 1 of 1 positions shown · non-contrast
Comparison: Two-view chest on 09/03/2010

CLINICAL DATA: S/p right shoulder surgery today, image done in
recovery

EXAM:
PORTABLE RIGHT SHOULDER

[shoulder ap]
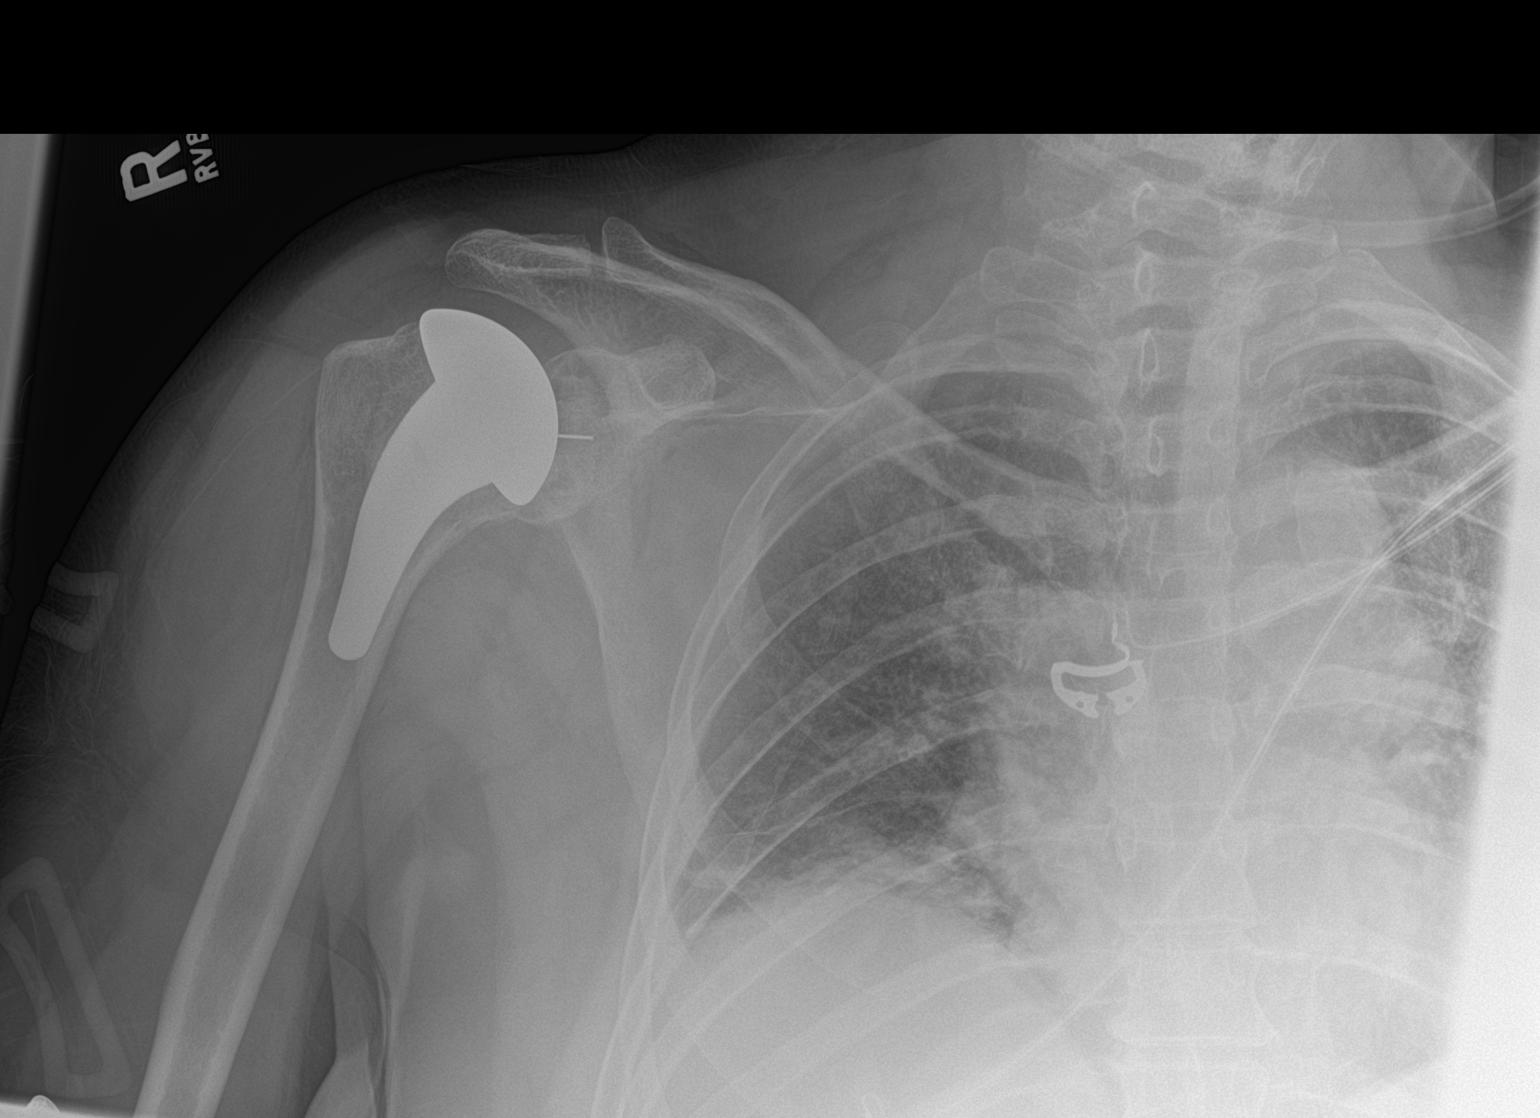

[1 of 1 positions shown; findings below may reference images not displayed]

FINDINGS: Status post RIGHT shoulder arthroplasty. Hardware appears intact. No
evidence for dislocation or fracture. Shallow lung inflation noted
in the lungs.
IMPRESSION: Status post RIGHT shoulder arthroplasty.

## 2022-01-27 ENCOUNTER — Ambulatory Visit: Payer: Self-pay | Attending: Hematology

## 2022-08-02 ENCOUNTER — Other Ambulatory Visit: Payer: Self-pay

## 2022-08-02 ENCOUNTER — Encounter: Payer: Self-pay | Admitting: Neurology

## 2022-08-02 DIAGNOSIS — R202 Paresthesia of skin: Secondary | ICD-10-CM

## 2022-08-09 ENCOUNTER — Encounter: Payer: Self-pay | Admitting: Neurology

## 2022-08-09 ENCOUNTER — Ambulatory Visit: Payer: BC Managed Care – PPO | Admitting: Neurology

## 2022-08-09 DIAGNOSIS — R202 Paresthesia of skin: Secondary | ICD-10-CM

## 2022-08-09 DIAGNOSIS — G5603 Carpal tunnel syndrome, bilateral upper limbs: Secondary | ICD-10-CM

## 2022-08-09 NOTE — Procedures (Signed)
[facility]  [address] [place], [address]  [place], [place] [address] Tel: [phone] Fax: [phone] Test Date:  08/09/2022  Patient: Deborah Walter DOB: Sep 28, 1960 Physician: Jacquelyne Balint, MD  Sex: Female Height: 5\' 9"  Ref Phys: Catalina Lunger, DO  ID#: [id number]   Technician:    History: This is a 62 year old female with numbness and tingling of the hands.  NCV & EMG Findings: Extensive electrodiagnostic evaluation of the right upper limb with additional nerve conduction studies of the left upper limb shows: Bilateral median sensory responses are absent. Bilateral ulnar and radial sensory responses are within normal limits. Right median (APB and lumbrical) motor responses are absent. Left median (APB) motor response shows prolonged distal peak latency (4.4 ms) and reduced amplitude (7 V). Right ulnar (ADM) motor response is within normal limits. Chronic motor axon loss changes without accompanying active denervation changes are seen in the right abductor pollicis brevis muscle. All other tested muscles are within normal limits with normal motor unit configuration and recruitment patterns, including cervical paraspinal (C7) muscle.  Impression: This is an abnormal study. The findings are most consistent with the following: Evidence of bilateral median mononeuropathy at or distal to the wrist, consistent with carpal tunnel syndrome, severe in degree bilaterally (right worse than left). No electrodiagnostic evidence of a right cervical (C5-T1) motor radiculopathy. Screening studies for right ulnar or radial mononeuropathies are normal.    ___________________________ Jacquelyne Balint, MD    Nerve Conduction Studies Motor Nerve Results    Latency Amplitude F-Lat Segment Distance CV Comment  Site (ms) Norm (mV) Norm (ms)  (cm) (m/s) Norm   Left Median (APB) Motor  Wrist *5.4  < 4.0 *3.9  > 5.0        Right Median (APB) Motor  Wrist *NR  < 4.0 *NR  > 5.0        Elbow *NR -  *NR -  Elbow-Wrist - *NR  > 50   Right Median/Ulnar (Lumb-Dors Int II) Motor  Wrist *NR  < 4.5 *NR  > 1.00        Right Ulnar (ADM) Motor  Wrist 2.5  < 3.1 10.8  > 7.0        Bel elbow 6.4 - 9.7 -  Bel elbow-Wrist 23.5 60  > 50   Ab elbow 8.1 - 9.2 -  Ab elbow-Bel elbow 10 59 -    Sensory Sites    Neg Peak Lat Amplitude (O-P) Segment Distance Velocity Comment  Site (ms) Norm (V) Norm  (cm) (ms)   Left Median Sensory  Wrist-Dig II *4.4  < 3.8 *7  > 10 Wrist-Dig II 13    Right Median Sensory  Wrist-Dig II *NR  < 3.8 *NR  > 10 Wrist-Dig II 13    Left Radial Sensory  Forearm-Wrist 2.2  < 2.8 22  > 10 Forearm-Wrist 10    Right Radial Sensory  Forearm-Wrist 2.3  < 2.8 34  > 10 Forearm-Wrist 10    Left Ulnar Sensory  Wrist-Dig V 2.8  < 3.2 26  > 5 Wrist-Dig V 11    Right Ulnar Sensory  Wrist-Dig V 2.6  < 3.2 22  > 5 Wrist-Dig V 11     Electromyography   Side Muscle Ins.Act Fibs Fasc Recrt Amp Dur Poly Activation Comment  Right FDI Nml Nml Nml Nml Nml Nml Nml Nml N/A  Right EIP Nml Nml Nml Nml Nml Nml Nml Nml N/A  Right FPL Nml Nml Nml  Nml Nml Nml Nml Nml N/A  Right APB Nml Nml Nml *2- *1+ *1+ *1+ Nml N/A  Right Pronator teres Nml Nml Nml Nml Nml Nml Nml Nml N/A  Right Biceps Nml Nml Nml Nml Nml Nml Nml Nml N/A  Right Triceps Nml Nml Nml Nml Nml Nml Nml Nml N/A  Right Deltoid Nml Nml Nml Nml Nml Nml Nml Nml N/A  Right C7 PSP Nml Nml Nml Nml Nml Nml Nml Nml N/A      Waveforms:  Motor           Sensory

## 2022-09-09 ENCOUNTER — Other Ambulatory Visit: Payer: Self-pay | Admitting: Surgery

## 2022-09-09 DIAGNOSIS — C50911 Malignant neoplasm of unspecified site of right female breast: Secondary | ICD-10-CM

## 2022-09-09 DIAGNOSIS — C50912 Malignant neoplasm of unspecified site of left female breast: Secondary | ICD-10-CM

## 2022-09-13 ENCOUNTER — Telehealth: Payer: Self-pay | Admitting: Radiation Oncology

## 2022-09-13 NOTE — Telephone Encounter (Signed)
Called patient to schedule a consultation w. Dr. Moody. No answer, LVM for a return call.  

## 2022-09-14 ENCOUNTER — Telehealth: Payer: Self-pay | Admitting: Radiation Oncology

## 2022-09-14 NOTE — Telephone Encounter (Signed)
Called patient to schedule a consultation w. Dr. Moody. No answer, LVM for a return call.  

## 2022-09-18 ENCOUNTER — Ambulatory Visit (HOSPITAL_COMMUNITY)
Admission: RE | Admit: 2022-09-18 | Discharge: 2022-09-18 | Disposition: A | Discharge status: Home / Self Care | Payer: BC Managed Care – PPO | Source: Ambulatory Visit | Attending: Surgery | Admitting: Surgery

## 2022-09-18 DIAGNOSIS — C50911 Malignant neoplasm of unspecified site of right female breast: Secondary | ICD-10-CM | POA: Insufficient documentation

## 2022-09-18 DIAGNOSIS — C50912 Malignant neoplasm of unspecified site of left female breast: Secondary | ICD-10-CM | POA: Diagnosis present

## 2022-09-18 MED ORDER — GADOBUTROL 1 MMOL/ML IV SOLN
10.0000 mL | Freq: Once | INTRAVENOUS | Status: AC | PRN
  Administered 2022-09-18: 10 mL via INTRAVENOUS

## 2022-09-20 ENCOUNTER — Inpatient Hospital Stay
Admission: RE | Admit: 2022-09-20 | Discharge: 2022-09-20 | Disposition: A | Discharge status: Home / Self Care | Payer: Self-pay | Source: Ambulatory Visit | Attending: Radiation Oncology | Admitting: Radiation Oncology

## 2022-09-20 ENCOUNTER — Other Ambulatory Visit: Payer: Self-pay | Admitting: Radiation Oncology

## 2022-09-20 DIAGNOSIS — C50911 Malignant neoplasm of unspecified site of right female breast: Secondary | ICD-10-CM

## 2022-09-20 DIAGNOSIS — C50912 Malignant neoplasm of unspecified site of left female breast: Secondary | ICD-10-CM

## 2022-09-20 NOTE — Progress Notes (Incomplete)
New Breast Cancer Diagnosis: Bilateral Breast  Did patient present with symptoms (if so, please note symptoms) or screening mammography?: Diagnostic imaging showed a mass in the 12:30 position of the left breast measuring 8 mm, and adjacent mass at 1:00 measuring 6 mm. There was also a 1.5 cm intraductal mass in the right breast at 8:00 position, as well as a mass in the 10:00 right breast measuring 8 mm. There was also unchanged posterior central right breast asymmetry. No adenopathy was noted in either axilla.    MRI Breast 09/18/2022:Enhancing mass in the RIGHT axillary tail measuring 1.0 cm consistent with biopsy proven malignancy. Enhancing mass in the outer retroareolar RIGHT breast measuring 0.9 cm which most likely represents the biopsy-proven papilloma though associated clip artifact is not definitely seen.  Adjacent areas of non mass enhancement in the far upper outer LEFT breast consistent with biopsy proven malignancy. The larger measures 1.5 cm in the smaller measures 0.8 cm.  Additional enhancing small masses in the medial RIGHT breast and lower inner right breast which are indeterminate.  Additional small enhancing mass in the upper central posterior LEFT breast which is also indeterminate.   Histology per Pathology Report:    Prognostic Markers  Left Breast, needle core biopsy, 12:30 and 1:00 position     Right Breast, needle core biopsy, 8:00 and 10:00 position  Prognostics pending  Surgeon and surgical plan, if any:  Dr. Magnus Ivan 09/07/2022 -Given the multiple areas of cancer in the left breast as well as the cancer in the right with calcifications, bilateral breast MRI strongly recommended by both radiology and myself to evaluate the extent of disease and whether or not she could be a candidate for breast conservation surgery versus bilateral mastectomies.  -We will refer her to both medical and radiation oncology and we will schedule the MRI.  -Should she need bilateral  mastectomy she is interested in immediate reconstruction. I also discussed that at the time of surgery she would need sentinel lymph node biopsies on both sides as well.  -Again, all surgical decisions will be made once the MRI is performed and we know if other biopsies may be needed.    Medical oncologist, treatment if any:   Dr. Mosetta Putt 09/21/2022 11am   Family History of Breast/Ovarian/Prostate Cancer: no.  Lymphedema issues, if any:  no    Pain issues, if any:  no   SAFETY ISSUES: Prior radiation? no Pacemaker/ICD? no Possible current pregnancy? Postmenopausal Is the patient on methotrexate? no  Current Complaints / other details:  She can't think of anything as of right now.

## 2022-09-20 NOTE — Progress Notes (Signed)
Radiation Oncology         [phone] ________________________________  Name: Deborah Walter        MRN: [id number]  Date of Service: 09/21/2022 DOB: 02-Apr-1960  MW:[id number], Manfred Arch, DO  Abigail Miyamoto, MD     REFERRING PHYSICIAN: Abigail Miyamoto, MD   DIAGNOSIS: {There were no encounter diagnoses. (Refresh or delete this SmartLink)}   HISTORY OF PRESENT ILLNESS: Deborah Walter is a 62 y.o. female seen in the multidisciplinary breast clinic for a new diagnosis of bilateral breast cancer. The patient was noted to have screening detected masses by mammogram on 07/28/22 at [facility]. Further diagnostic imaging showed a mass in the 12:30 position of the left breast measuring 8 mm, and adjacent mass at 1:00 measuring 6 mm. There was also a 1.5 cm intraductal mass int he right breast at 8:00 position, as well as a mass in the 10:00 right breast measuring 8 mm. There was also unchanged posterior central right breast asymmetry. No adenopathy was noted in either axilla. Multiple biopsies were taken on 08/11/22. The left breast at 12:30 showed grade 2 invasive lobular carcinoma, and the 1:00 left breast biopsy showed similar grade 2 invasive mammary carcinoma that was ER/PR positive, HER2 negative with Ki-67 of 5%. The right core biopsy at 8:00 showed intraductal papilloma, and the 10:00 specimen showed grade 1, invasive ductal carcinoma that was ER/PR positive, HER2 negative with a Ki 67 of 15%.   She had an MRI breast on 09/18/22 that showed ***   PREVIOUS RADIATION THERAPY: {EXAM; YES/NO:19492::"No"}   PAST MEDICAL HISTORY:  Past Medical History:  Diagnosis Date   Acid reflux    Arthritis    osteoarthritis   Elevated cholesterol    Family history of adverse reaction to anesthesia    mother- hard to wake her up   H/O seasonal allergies    Hypertension        PAST SURGICAL HISTORY: Past Surgical History:  Procedure Laterality Date   CLAVICLE SURGERY Right Early 1990   COLONOSCOPY WITH PROPOFOL  N/A 02/27/2018   Procedure: COLONOSCOPY WITH PROPOFOL;  Surgeon: Corbin Ade, MD;  Location: [facility];  Service: Endoscopy;  Laterality: N/A;  8:30am   CYSTECTOMY  Early 1990   Bottom of spine   NASAL SEPTOPLASTY W/ TURBINOPLASTY Bilateral 09/01/2015   Procedure: NASAL SEPTOPLASTY WITH TURBINATE REDUCTION;  Surgeon: Newman Pies, MD;  Location: [facility];  Service: ENT;  Laterality: Bilateral;   POLYPECTOMY  02/27/2018   Procedure: POLYPECTOMY;  Surgeon: Corbin Ade, MD;  Location: [facility];  Service: Endoscopy;;  hepatic flexure, rectum   SINOSCOPY     SINUS ENDO WITH FUSION Left 09/01/2015   Procedure: LEFT ENDOSCOPIC TOTAL ETHMOIDECTOMY LEFT ENDOSCOPIC  MAXILLARY ANTROSTOMY LEFT ENDOSCOPIC FRONTAL RECESS EXPLORATION;  Surgeon: Newman Pies, MD;  Location: [facility];  Service: ENT;  Laterality: Left;   TOTAL SHOULDER ARTHROPLASTY Right 11/09/2018   Procedure: TOTAL SHOULDER ARTHROPLASTY;  Surgeon: Jones Broom, MD;  Location: [facility];  Service: Orthopedics;  Laterality: Right;   TUBAL LIGATION  1982     FAMILY HISTORY:  Family History  Problem Relation Age of Onset   Asthma Father    Asthma Sister    Allergic rhinitis Neg Hx    Eczema Neg Hx    Immunodeficiency Neg Hx    Urticaria Neg Hx    Angioedema Neg Hx    Atopy Neg Hx    Colon cancer Neg Hx  Colon polyps Neg Hx      SOCIAL HISTORY:  reports that she quit smoking about 8 years ago. Her smoking use included cigarettes. She started smoking about 41 years ago. She has a 66 pack-year smoking history. She has never used smokeless tobacco. She reports current alcohol use. She reports that she does not use drugs.   ALLERGIES: Amoxicillin, Atorvastatin, Diclofenac, Dust mite extract, Gramineae pollens, Other, Penicillins, Pravastatin, Cefuroxime, and Tizanidine hcl   MEDICATIONS:  Current Outpatient Medications  Medication Sig Dispense Refill   Carboxymethylcellulose Sodium (THERATEARS OP)  Place 1 drop into both eyes 2 (two) times daily as needed (dry eyes).     famotidine (PEPCID) 40 MG tablet Take 40 mg by mouth at bedtime.      fluticasone (FLONASE) 50 MCG/ACT nasal spray Place 1 spray into both nostrils 2 (two) times daily as needed for allergies. 16 g 5   lisinopril-hydrochlorothiazide (PRINZIDE,ZESTORETIC) 20-25 MG tablet Take 1 tablet by mouth daily.     omeprazole (PRILOSEC) 20 MG capsule Take 20 mg by mouth daily.   0   oxyCODONE-acetaminophen (PERCOCET) 5-325 MG tablet Take 1-2 tablets every 4 hours as needed for post operative pain. MAX 6/day 30 tablet 0   PROAIR HFA 108 (90 Base) MCG/ACT inhaler INHALE 2 PUFFS BY MOUTH EVERY 6 HOURS AS NEEDED FOR WHEEZING FOR SHORTNESS OF BREATH 18 g 2   Spacer/Aero-Holding Chambers (AEROCHAMBER PLUS WITH MASK) inhaler 1 each by Other route as needed. 1 each 2   tiZANidine (ZANAFLEX) 4 MG tablet Take 1 tablet (4 mg total) by mouth every 8 (eight) hours as needed for muscle spasms. (Patient not taking: Reported on 03/14/2019) 30 tablet 1   No current facility-administered medications for this visit.     REVIEW OF SYSTEMS: On review of systems, the patient reports that she is doing ***     PHYSICAL EXAM:  Wt Readings from Last 3 Encounters:  03/14/19 237 lb 3.2 oz (107.6 kg)  11/09/18 219 lb (99.3 kg)  11/02/18 219 lb (99.3 kg)   Temp Readings from Last 3 Encounters:  03/14/19 97.7 F (36.5 C) (Temporal)  11/10/18 97.7 F (36.5 C)  11/02/18 98.4 F (36.9 C) (Oral)   BP Readings from Last 3 Encounters:  03/14/19 128/70  11/10/18 (!) 124/58  11/02/18 115/75   Pulse Readings from Last 3 Encounters:  03/14/19 84  11/10/18 82  11/02/18 (!) 111    In general this is a well appearing *** female in no acute distress. She's alert and oriented x4 and appropriate throughout the examination. Cardiopulmonary assessment is negative for acute distress and she exhibits normal effort. Bilateral breast exam is deferred.    ECOG =  ***  0 - Asymptomatic (Fully active, able to carry on all predisease activities without restriction)  1 - Symptomatic but completely ambulatory (Restricted in physically strenuous activity but ambulatory and able to carry out work of a light or sedentary nature. For example, light housework, office work)  2 - Symptomatic, <50% in bed during the day (Ambulatory and capable of all self care but unable to carry out any work activities. Up and about more than 50% of waking hours)  3 - Symptomatic, >50% in bed, but not bedbound (Capable of only limited self-care, confined to bed or chair 50% or more of waking hours)  4 - Bedbound (Completely disabled. Cannot carry on any self-care. Totally confined to bed or chair)  5 - Death   Santiago Glad MM, Creech RH, Tormey DC,  et al. [phone]). "Toxicity and response criteria of the [facility]". Am. Evlyn Clines. Oncol. 5 (6): 649-55    LABORATORY DATA:  Lab Results  Component Value Date   WBC 11.8 (H) 11/10/2018   HGB 11.9 (L) 11/10/2018   HCT 37.7 11/10/2018   MCV 95.7 11/10/2018   PLT 239 11/10/2018   Lab Results  Component Value Date   NA 140 11/10/2018   K 5.3 (H) 11/10/2018   CL 105 11/10/2018   CO2 25 11/10/2018   No results found for: "ALT", "AST", "GGT", "ALKPHOS", "BILITOT"    RADIOGRAPHY: No results found.     IMPRESSION/PLAN: 1. 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 Dr. Mitzi Hansen discusses the pathology findings and reviews the nature of early stage bilateral breast disease. She has undergone an MRI of bilateral breasts for extent of disease. *** we will follow up with her decision making and if she proceeds with breast conserving surgery, Dr. Mitzi Hansen would offer adjuvant external radiotherapy to the breast  to reduce risks of local recurrence followed by antiestrogen  therapy. Further Oncotype Dx testing may also be considered pending final pathology, and if chemotherapy were recommended, she is aware this will precede radiotherapy. We discussed the risks, benefits, short, and long term effects of radiotherapy, as well as the curative intent if she proceeds with breast conserving surgery. Dr. Mitzi Hansen discusses the delivery and logistics of radiotherapy and anticipates a course of 4-6 1/2 weeks of radiotherapy to bilateral breasts.. We will see her back a few weeks after surgery to discuss the simulation process and anticipate we starting radiotherapy about 4-6 weeks after surgery.  2. Possible genetic predisposition to malignancy. The patient is a candidate for genetic testing given her personal history. She will be offered ***   In a visit lasting *** minutes, greater than 50% of the time was spent face to face reviewing her case, as well as in preparation of, discussing, and coordinating the patient's care.  The above documentation reflects my direct findings during this shared patient visit. Please see the separate note by Dr. Mitzi Hansen on this date for the remainder of the patient's plan of care.    Osker Mason, [facility]    **Disclaimer: This note was dictated with voice recognition software. Similar sounding words can inadvertently be transcribed and this note may contain transcription errors which may not have been corrected upon publication of note.**

## 2022-09-21 ENCOUNTER — Inpatient Hospital Stay: Payer: BC Managed Care – PPO | Attending: Hematology

## 2022-09-21 ENCOUNTER — Encounter: Payer: Self-pay | Admitting: Hematology

## 2022-09-21 ENCOUNTER — Encounter: Payer: Self-pay | Admitting: Radiation Oncology

## 2022-09-21 ENCOUNTER — Ambulatory Visit
Admission: RE | Admit: 2022-09-21 | Discharge: 2022-09-21 | Disposition: A | Discharge status: Home / Self Care | Payer: BC Managed Care – PPO | Source: Ambulatory Visit | Attending: Radiation Oncology | Admitting: Radiation Oncology

## 2022-09-21 ENCOUNTER — Inpatient Hospital Stay (HOSPITAL_BASED_OUTPATIENT_CLINIC_OR_DEPARTMENT_OTHER): Payer: BC Managed Care – PPO | Admitting: Hematology

## 2022-09-21 VITALS — BP 147/64 | HR 74 | Temp 98.0°F | Resp 18 | Ht 68.8 in | Wt 229.8 lb

## 2022-09-21 DIAGNOSIS — Z87891 Personal history of nicotine dependence: Secondary | ICD-10-CM | POA: Insufficient documentation

## 2022-09-21 DIAGNOSIS — I10 Essential (primary) hypertension: Secondary | ICD-10-CM | POA: Insufficient documentation

## 2022-09-21 DIAGNOSIS — Z880 Allergy status to penicillin: Secondary | ICD-10-CM | POA: Insufficient documentation

## 2022-09-21 DIAGNOSIS — N6332 Unspecified lump in axillary tail of the left breast: Secondary | ICD-10-CM | POA: Diagnosis not present

## 2022-09-21 DIAGNOSIS — Z79899 Other long term (current) drug therapy: Secondary | ICD-10-CM | POA: Insufficient documentation

## 2022-09-21 DIAGNOSIS — Z801 Family history of malignant neoplasm of trachea, bronchus and lung: Secondary | ICD-10-CM | POA: Insufficient documentation

## 2022-09-21 DIAGNOSIS — N6313 Unspecified lump in the right breast, lower outer quadrant: Secondary | ICD-10-CM

## 2022-09-21 DIAGNOSIS — Z170 Estrogen receptor positive status [ER+]: Secondary | ICD-10-CM

## 2022-09-21 DIAGNOSIS — N6324 Unspecified lump in the left breast, lower inner quadrant: Secondary | ICD-10-CM

## 2022-09-21 DIAGNOSIS — E7800 Pure hypercholesterolemia, unspecified: Secondary | ICD-10-CM | POA: Insufficient documentation

## 2022-09-21 DIAGNOSIS — C50412 Malignant neoplasm of upper-outer quadrant of left female breast: Secondary | ICD-10-CM | POA: Insufficient documentation

## 2022-09-21 DIAGNOSIS — C50411 Malignant neoplasm of upper-outer quadrant of right female breast: Secondary | ICD-10-CM | POA: Insufficient documentation

## 2022-09-21 DIAGNOSIS — M549 Dorsalgia, unspecified: Secondary | ICD-10-CM | POA: Diagnosis not present

## 2022-09-21 DIAGNOSIS — Z881 Allergy status to other antibiotic agents status: Secondary | ICD-10-CM

## 2022-09-21 DIAGNOSIS — Z9851 Tubal ligation status: Secondary | ICD-10-CM | POA: Insufficient documentation

## 2022-09-21 DIAGNOSIS — K219 Gastro-esophageal reflux disease without esophagitis: Secondary | ICD-10-CM | POA: Insufficient documentation

## 2022-09-21 DIAGNOSIS — Z825 Family history of asthma and other chronic lower respiratory diseases: Secondary | ICD-10-CM | POA: Diagnosis not present

## 2022-09-21 DIAGNOSIS — R921 Mammographic calcification found on diagnostic imaging of breast: Secondary | ICD-10-CM

## 2022-09-21 DIAGNOSIS — M1990 Unspecified osteoarthritis, unspecified site: Secondary | ICD-10-CM | POA: Insufficient documentation

## 2022-09-21 LAB — COMPREHENSIVE METABOLIC PANEL
ALT: 26 U/L (ref 0–44)
AST: 26 U/L (ref 15–41)
Albumin: 4.6 g/dL (ref 3.5–5.0)
Alkaline Phosphatase: 80 U/L (ref 38–126)
Anion gap: 8 (ref 5–15)
BUN: 29 mg/dL — ABNORMAL HIGH (ref 8–23)
CO2: 29 mmol/L (ref 22–32)
Calcium: 10.1 mg/dL (ref 8.9–10.3)
Chloride: 101 mmol/L (ref 98–111)
Creatinine, Ser: 1.06 mg/dL — ABNORMAL HIGH (ref 0.44–1.00)
GFR, Estimated: 59 mL/min — ABNORMAL LOW (ref >60)
Glucose, Bld: 115 mg/dL — ABNORMAL HIGH (ref 70–99)
Potassium: 4.4 mmol/L (ref 3.5–5.1)
Sodium: 138 mmol/L (ref 135–145)
Total Bilirubin: 0.6 mg/dL (ref 0.3–1.2)
Total Protein: 7.7 g/dL (ref 6.5–8.1)

## 2022-09-21 LAB — CBC WITH DIFFERENTIAL/PLATELET
Abs Immature Granulocytes: 0.02 10*3/uL (ref 0.00–0.07)
Basophils Absolute: 0.1 10*3/uL (ref 0.0–0.1)
Basophils Relative: 1 %
Eosinophils Absolute: 0.2 10*3/uL (ref 0.0–0.5)
Eosinophils Relative: 3 %
HCT: 39.5 % (ref 36.0–46.0)
Hemoglobin: 13.5 g/dL (ref 12.0–15.0)
Immature Granulocytes: 0 %
Lymphocytes Relative: 30 %
Lymphs Abs: 2.2 10*3/uL (ref 0.7–4.0)
MCH: 32.1 pg (ref 26.0–34.0)
MCHC: 34.2 g/dL (ref 30.0–36.0)
MCV: 93.8 fL (ref 80.0–100.0)
Monocytes Absolute: 0.6 10*3/uL (ref 0.1–1.0)
Monocytes Relative: 8 %
Neutro Abs: 4.4 10*3/uL (ref 1.7–7.7)
Neutrophils Relative %: 58 %
Platelets: 285 10*3/uL (ref 150–400)
RBC: 4.21 MIL/uL (ref 3.87–5.11)
RDW: 13.5 % (ref 11.5–15.5)
WBC: 7.4 10*3/uL (ref 4.0–10.5)
nRBC: 0 % (ref 0.0–0.2)

## 2022-09-21 NOTE — Addendum Note (Signed)
Encounter addended by: Rana Snare, LPN on: 6/57/8469 4:43 PM  Actions taken: Charge Capture section accepted

## 2022-09-21 NOTE — Progress Notes (Unsigned)
[facility]   Telephone:([phone] Fax:([phone]   Clinic New Consult Note   Patient Care Team: Catalina Lunger, DO as PCP - General (Family Medicine)  Date of Service:  09/21/2022   CHIEF COMPLAINTS/PURPOSE OF CONSULTATION:  bilateral Breast Cancer, ER+  REFERRING PHYSICIAN:  Joylene Grapes   ASSESSMENT & PLAN:  Deborah Walter is a 62 y.o. {post-menopausal female with a history of   1. Bilateral breast cancer, invasive mammary carcinoma, grade 1-2, cT1N0M0, stage IA, ER+/PR+/HER2- -this was discovered on screening mammogram.   -I discussed her breast imaging and needle biopsy results with patient and her family members in great detail. -She has bilateral biopsy confirmed stage I invasive mammary carcinoma (2 lesions in left breast ad one in right breast), left breast cancer strongly ER/PR positive, HER2 negative.  I do not see prognostic panel without Mosetta Putt right breast biopsy. -I reviewed her bilateral breast MRI from September 18, 2022, which showed additional non-mass enhancement in the medial right breast in the lower inner right breast which are indeterminate, additional small enhancing mass in the central posterior left breast which is also indeterminate.  Patient is interested in lumpectomy if possible, I will refer her for MRI guided bilateral breast biopsy. -She has been seen by breast surgeon Dr. Magnus Ivan, will meet him again after additional biopsy, to determine her surgery.  She understands that thrombectomies may not feasible if she has multifocal disease, especially in different quadrants. -She will see Dr. Mitzi Hansen to discuss adjuvant radiation -I recommend Oncotype Dx on her surgical sample, likely from the left breast given higher grade disease, to determine increased she needs adjuvant chemotherapy. -I also discussed the role of adjuvant antiestrogen therapy for 5 to 10 years, to reduce her risk of recurrence.  She is interested -We discussed breast  cancer surveillance after she completes treatment, Including annual mammogram, breast exam every 6-12 months.  Plan -Bilateral MRI guided the breast biopsy for additional suspicious areas -She will see Dr. Magnus Ivan back after the biopsy to determine surgery -She will proceed with adjuvant radiation if she undergoes lumpectomy -Oncotype on her surgical sample (the highest grade tumor) -I will see her back after her radiation, or sooner if needed.  2. Bone Health  -She has never had a DEXA. We will obtain one for baseline   PLAN:  -proceed with additional MRI guided biopsy -She was seeing a surgeon Dr. Magnus Ivan back after additional biopsies - I recommend Oncotype on her surgical sample -I will see her back at the end of the radiation, or sooner if needed.    HISTORY OF PRESENTING ILLNESS:  Deborah Walter 62 y.o. female is a here because of breast cancer. The patient was referred by Dr.Douglas Blackmon. The patient presents to the clinic today alone.  This is discovered by screening mammogram which showed abnormalities in both breasts.  She underwent diagnostic mammogram in July 2024 which showed a 8 mm mass in the left breast at the 12:30 position and a 6 mm mass at the 1 o'clock position of left breast.  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  In the right breast she has 1.5 cm mass at the 8 o'clock position which showed papilloma on biopsy, and a 6 mm mass in the right breast 10 o'clock position with biopsy confirmed invasive ductal carcinoma.  Prognostic panel was done on the biopsy from left breast which showed ER 80% strongly positive, PR 100% strongly positive, HER2 negative by FISH,  Ki-67 5%.  Pt state that she had skipped a couple of mammograms due to no health insurance. Pt state that she has knee and back pain. When state when she has a flare up she take an antiinflammatory.    She has a PMHx of.... Hypertension Paternal Aunt -lung  cancer  Socially... 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 Social drinker  GYN HISTORY  Menarchal: 10 LMP: 36 Contraceptive: HRT: no GP: G2 P2    REVIEW OF SYSTEMS:    Constitutional: (-)Denies fevers, chills or abnormal night sweats Eyes: (-)Denies blurriness of vision, double vision or watery eyes Ears, nose, mouth, throat, and face: Denies mucositis or sore throat Respiratory: Denies cough, dyspnea or wheezes Cardiovascular: Denies palpitation, chest discomfort or lower extremity swelling Gastrointestinal:  Denies nausea, heartburn or change in bowel habits Skin: Denies abnormal skin rashes Lymphatics: Denies new lymphadenopathy or easy bruising Neurological:Denies numbness, tingling or new weaknesses Behavioral/Psych: Mood is stable, no new changes  All other systems were reviewed with the patient and are negative.   MEDICAL HISTORY:  Past Medical History:  Diagnosis Date   Acid reflux    Arthritis    osteoarthritis   Elevated cholesterol    Family history of adverse reaction to anesthesia    mother- hard to wake her up   H/O seasonal allergies    Hypertension     SURGICAL HISTORY: Past Surgical History:  Procedure Laterality Date   CLAVICLE SURGERY Right Early 1990   COLONOSCOPY WITH PROPOFOL N/A 02/27/2018   Procedure: COLONOSCOPY WITH PROPOFOL;  Surgeon: Corbin Ade, MD;  [facility]: [facility];  Service: Endoscopy;  Laterality: N/A;  8:30am   CYSTECTOMY  Early 1990   Bottom of spine   NASAL SEPTOPLASTY W/ TURBINOPLASTY Bilateral 09/01/2015   Procedure: NASAL SEPTOPLASTY WITH TURBINATE REDUCTION;  Surgeon: Newman Pies, MD;  Location: [facility];  Service: ENT;  Laterality: Bilateral;   POLYPECTOMY  02/27/2018   Procedure: POLYPECTOMY;  Surgeon: Corbin Ade, MD;  Location: [facility];  Service: Endoscopy;;  hepatic flexure, rectum   SINOSCOPY     SINUS ENDO WITH FUSION Left 09/01/2015   Procedure: LEFT ENDOSCOPIC TOTAL  ETHMOIDECTOMY LEFT ENDOSCOPIC  MAXILLARY ANTROSTOMY LEFT ENDOSCOPIC FRONTAL RECESS EXPLORATION;  Surgeon: Newman Pies, MD;  Location: [facility];  Service: ENT;  Laterality: Left;   TOTAL SHOULDER ARTHROPLASTY Right 11/09/2018   Procedure: TOTAL SHOULDER ARTHROPLASTY;  Surgeon: Jones Broom, MD;  Location: [facility];  Service: Orthopedics;  Laterality: Right;   TUBAL LIGATION  1982    SOCIAL HISTORY: Social History   Socioeconomic History   Marital status: Single    Spouse name: Not on file   Number of children: Not on file   Years of education: Not on file   Highest education level: Not on file  Occupational History   Not on file  Tobacco Use   Smoking status: Former    Current packs/day: 0.00    Average packs/day: 2.0 packs/day for 33.0 years (66.0 ttl pk-yrs)    Types: Cigarettes    Start date: 09/13/1981    Quit date: 09/14/2014    Years since quitting: 8.0   Smokeless tobacco: Never  Vaping Use   Vaping status: Never Used  Substance and Sexual Activity   Alcohol use: Yes    Alcohol/week: 0.0 standard drinks of alcohol    Comment: rare   Drug use: No   Sexual activity: Not Currently    Birth control/protection:  Post-menopausal  Other Topics Concern   Not on file  Social History Narrative   Not on file   Social Determinants of Health   Financial Resource Strain: Low Risk  (07/28/2022)   Received [facility], [facility]   Overall Financial Resource Strain (CARDIA)    Difficulty of Paying Living Expenses: Not hard at all  Food Insecurity: No Food Insecurity (07/28/2022)   Received [facility], [facility]   Hunger Vital Sign    Worried About Running Out of Food in the Last Year: Never true    Ran Out of Food in the Last Year: Never true  Transportation Needs: No Transportation Needs (07/28/2022)   Received from [facility], [facility]   [facility] - Transportation    Lack of Transportation (Medical): No    Lack of  Transportation (Non-Medical): No  Physical Activity: Sufficiently Active (07/28/2022)   Received from [facility], [facility]   Exercise Vital Sign    Days of Exercise per Week: 4 days    Minutes of Exercise per Session: 40 min  Stress: No Stress Concern Present (07/28/2022)   Received from [facility], [facility] - Occupational Stress Questionnaire    Feeling of Stress : Not at all  Social Connections: Socially Integrated (07/28/2022)   Received from [facility], [facility]   Social Connection and Isolation Panel [NHANES]    Frequency of Communication with Friends and Family: Three times a week    Frequency of Social Gatherings with Friends and Family: Twice a week    Attends Religious Services: 1 to 4 times per year    Active Member of [employer] or Organizations: No    Attends Engineer, structural: 1 to 4 times per year    Marital Status: Married  Catering manager Violence: Not At Risk (07/28/2022)   Received from [facility], [facility]   Humiliation, Afraid, Rape, and Kick questionnaire    Fear of Current or Ex-Partner: No    Emotionally Abused: No    Physically Abused: No    Sexually Abused: No    FAMILY HISTORY: Family History  Problem Relation Age of Onset   Asthma Father    Asthma Sister    Allergic rhinitis Neg Hx    Eczema Neg Hx    Immunodeficiency Neg Hx    Urticaria Neg Hx    Angioedema Neg Hx    Atopy Neg Hx    Colon cancer Neg Hx    Colon polyps Neg Hx     ALLERGIES:  is allergic to amoxicillin, atorvastatin, diclofenac, dust mite extract, gramineae pollens, other, penicillins, pravastatin, cefuroxime, and tizanidine hcl.  MEDICATIONS:  Current Outpatient Medications  Medication Sig Dispense Refill   Carboxymethylcellulose Sodium (THERATEARS OP) Place 1 drop into both eyes 2 (two) times daily as needed (dry eyes).     famotidine (PEPCID) 40 MG tablet Take 40 mg by mouth at  bedtime.      fluticasone (FLONASE) 50 MCG/ACT nasal spray Place 1 spray into both nostrils 2 (two) times daily as needed for allergies. 16 g 5   lisinopril-hydrochlorothiazide (PRINZIDE,ZESTORETIC) 20-25 MG tablet Take 1 tablet by mouth daily.     omeprazole (PRILOSEC) 20 MG capsule Take 20 mg by mouth daily.   0   oxyCODONE-acetaminophen (PERCOCET) 5-325 MG tablet Take 1-2 tablets every 4 hours as needed for post operative  pain. MAX 6/day 30 tablet 0   PROAIR HFA 108 (90 Base) MCG/ACT inhaler INHALE 2 PUFFS BY MOUTH EVERY 6 HOURS AS NEEDED FOR WHEEZING FOR SHORTNESS OF BREATH 18 g 2   Spacer/Aero-Holding Chambers (AEROCHAMBER PLUS WITH MASK) inhaler 1 each by Other route as needed. 1 each 2   tiZANidine (ZANAFLEX) 4 MG tablet Take 1 tablet (4 mg total) by mouth every 8 (eight) hours as needed for muscle spasms. (Patient not taking: Reported on 03/14/2019) 30 tablet 1   No current facility-administered medications for this visit.    PHYSICAL EXAMINATION: ECOG PERFORMANCE STATUS: 0 - Asymptomatic  Vitals:   09/21/22 1119  BP: (!) 147/64  Pulse: 74  Resp: 18  Temp: 98 F (36.7 C)  SpO2: 98%   Filed Weights   09/21/22 1119  Weight: 229 lb 12.8 oz (104.2 kg)    GENERAL:alert, no distress and comfortable SKIN: skin color, texture, turgor are normal, no rashes or significant lesions EYES: normal, Conjunctiva are pink and non-injected, sclera clear NECK: (-)supple, thyroid normal size, non-tender, without nodularity LYMPH:(-)  no palpable lymphadenopathy in the cervical, axillary  LUNGS: clear to auscultation and percussion with normal breathing effort HEART: regular rate & rhythm and no murmurs and no lower extremity edema ABDOMEN:(-)abdomen soft, non-tender and normal bowel sounds Musculoskeletal:no cyanosis of digits and no clubbing  NEURO: alert & oriented x 3 with fluent speech, no focal motor/sensory deficits BREAST: Rt BREAST  No palpable mass, nodules or adenopathy  bilaterally. Breast exam benign. LT breast no palpable mass breast exam benign.  LABORATORY DATA:  I have reviewed the data as listed    Latest Ref Rng & Units 11/10/2018    3:05 AM 02/22/2018    2:03 PM  CBC  WBC 4.0 - 10.5 K/uL 11.8  6.1   Hemoglobin 12.0 - 15.0 g/dL 62.1  30.8   Hematocrit 36.0 - 46.0 % 37.7  42.2   Platelets 150 - 400 K/uL 239  265        Latest Ref Rng & Units 11/10/2018    3:05 AM 02/22/2018    2:03 PM 08/29/2015    9:03 AM  CMP  Glucose 70 - 99 mg/dL 657  846  962   BUN 6 - 20 mg/dL 20  20  44   Creatinine 0.44 - 1.00 mg/dL 9.52  8.41  3.24   Sodium 135 - 145 mmol/L 140  136  136   Potassium 3.5 - 5.1 mmol/L 5.3  3.8  4.3   Chloride 98 - 111 mmol/L 105  101  104   CO2 22 - 32 mmol/L 25  28  25    Calcium 8.9 - 10.3 mg/dL 9.4  9.0  9.5      RADIOGRAPHIC STUDIES: I have personally reviewed the radiological images as listed and agreed with the findings in the report. MR BREAST BILATERAL W WO CONTRAST INC CAD  Result Date: 09/20/2022 CLINICAL DATA:  Staging exam. Newly diagnosed bilateral breast cancer, 2 sites of invasive mammary carcinoma in the right and 1 site of invasive ductal carcinoma on the left as well as a separate intraductal papilloma. EXAM: BILATERAL BREAST MRI WITH AND WITHOUT CONTRAST TECHNIQUE: Multiplanar, multisequence MR images of both breasts were obtained prior to and following the intravenous administration of 10 ml of Gadavist Three-dimensional MR images were rendered by post-processing of the original MR data on an independent workstation. The three-dimensional MR images were interpreted, and findings are reported in the following complete MRI  report for this study. Three dimensional images were evaluated at the independent interpreting workstation using the DynaCAD thin client. COMPARISON:  None available. FINDINGS: Breast composition: b. Scattered fibroglandular tissue. Background parenchymal enhancement: Moderate. Right breast: There is  an enhancing mass in the right axillary tail measuring 1.0 cm consistent with biopsy proven malignancy (series 10, image 60). There is an enhancing mass in the outer retroareolar right breast measuring 0.9 cm (series 10, image 53) which most likely represents the biopsy-proven papilloma though associated clip artifact is not definitely seen. There is a cluster of tiny enhancing masses overall measuring 0.8 cm in the medial right breast (series 10, image 63). There is an additional enhancing mass in the lower inner right breast measuring 0.5 cm (series 10, image 49). Left breast: In the far upper outer left breast there are adjacent areas of non mass enhancement consistent with biopsy proven malignancy. The larger and more superior of these measures 1.5 x 0.8 x 1.0 cm (series 6, image 78). The more inferior and anterior measures 0.8 x 0.5 x 0.7 cm (series 6, image 72). There is a small suspicious enhancing mass in the upper central posterior right breast measuring 0.5 cm (series 6, image 63). Lymph nodes: No abnormal appearing lymph nodes. Ancillary findings:  None. IMPRESSION: 1. Enhancing mass in the RIGHT axillary tail measuring 1.0 cm consistent with biopsy proven malignancy. 2. Enhancing mass in the outer retroareolar RIGHT breast measuring 0.9 cm which most likely represents the biopsy-proven papilloma though associated clip artifact is not definitely seen. 3. Adjacent areas of non mass enhancement in the far upper outer LEFT breast consistent with biopsy proven malignancy. The larger measures 1.5 cm in the smaller measures 0.8 cm. 4. Additional enhancing small masses in the medial RIGHT breast and lower inner right breast which are indeterminate. 5. Additional small enhancing mass in the upper central posterior LEFT breast which is also indeterminate. RECOMMENDATION: 1.  MRI guided core needle biopsy x2 of the right breast. 2.  MRI guided core needle biopsy x1 of the left breast. 3. Patient also needs a right  breast stereotactic core needle biopsy based on previous post biopsy recommendation. BI-RADS CATEGORY  4: Suspicious. Electronically Signed   By: Emmaline Kluver M.D.   On: 09/20/2022 12:11     No orders of the defined types were placed in this encounter.   All questions were answered. The patient knows to call the clinic with any problems, questions or concerns. The total time spent in the appointment was 55 minutes.     Malachy Mood, MD 09/21/2022   I, Monica Martinez, am acting as scribe for Malachy Mood, MD.   I have reviewed the above documentation for accuracy and completeness, and I agree with the above.

## 2022-09-22 ENCOUNTER — Encounter: Payer: Self-pay | Admitting: Hematology

## 2022-09-22 LAB — CANCER ANTIGEN 27.29: CA 27.29: 46 U/mL — ABNORMAL HIGH (ref 0.0–38.6)

## 2022-09-23 ENCOUNTER — Other Ambulatory Visit: Payer: Self-pay | Admitting: Surgery

## 2022-09-23 ENCOUNTER — Encounter: Payer: Self-pay | Admitting: *Deleted

## 2022-09-23 DIAGNOSIS — R9389 Abnormal findings on diagnostic imaging of other specified body structures: Secondary | ICD-10-CM

## 2022-10-04 ENCOUNTER — Ambulatory Visit
Admission: RE | Admit: 2022-10-04 | Discharge: 2022-10-04 | Disposition: A | Discharge status: Home / Self Care | Payer: BC Managed Care – PPO | Source: Ambulatory Visit | Attending: Surgery | Admitting: Surgery

## 2022-10-04 DIAGNOSIS — R9389 Abnormal findings on diagnostic imaging of other specified body structures: Secondary | ICD-10-CM

## 2022-10-04 DIAGNOSIS — N6322 Unspecified lump in the left breast, upper inner quadrant: Secondary | ICD-10-CM

## 2022-10-04 DIAGNOSIS — N6314 Unspecified lump in the right breast, lower inner quadrant: Secondary | ICD-10-CM

## 2022-10-04 MED ORDER — GADOPICLENOL 0.5 MMOL/ML IV SOLN
10.0000 mL | Freq: Once | INTRAVENOUS | Status: AC | PRN
  Administered 2022-10-04: 10 mL via INTRAVENOUS

## 2022-10-05 ENCOUNTER — Ambulatory Visit
Admission: RE | Admit: 2022-10-05 | Discharge: 2022-10-05 | Disposition: A | Discharge status: Home / Self Care | Payer: BC Managed Care – PPO | Source: Ambulatory Visit | Attending: Surgery | Admitting: Surgery

## 2022-10-05 ENCOUNTER — Other Ambulatory Visit: Payer: Self-pay | Admitting: Surgery

## 2022-10-05 ENCOUNTER — Encounter: Payer: Self-pay | Admitting: *Deleted

## 2022-10-05 DIAGNOSIS — Z9889 Other specified postprocedural states: Secondary | ICD-10-CM

## 2022-10-05 LAB — SURGICAL PATHOLOGY

## 2022-10-06 ENCOUNTER — Encounter: Payer: Self-pay | Admitting: *Deleted

## 2022-10-11 ENCOUNTER — Ambulatory Visit
Admission: RE | Admit: 2022-10-11 | Discharge: 2022-10-11 | Disposition: A | Discharge status: Home / Self Care | Payer: BC Managed Care – PPO | Source: Ambulatory Visit | Attending: Surgery | Admitting: Surgery

## 2022-10-11 DIAGNOSIS — R9389 Abnormal findings on diagnostic imaging of other specified body structures: Secondary | ICD-10-CM

## 2022-10-11 DIAGNOSIS — R921 Mammographic calcification found on diagnostic imaging of breast: Secondary | ICD-10-CM

## 2022-10-11 HISTORY — PX: BREAST BIOPSY: SHX20

## 2022-10-15 ENCOUNTER — Other Ambulatory Visit: Payer: Self-pay | Admitting: Surgery

## 2022-10-15 DIAGNOSIS — C50911 Malignant neoplasm of unspecified site of right female breast: Secondary | ICD-10-CM

## 2022-10-19 ENCOUNTER — Telehealth: Payer: Self-pay

## 2022-10-19 ENCOUNTER — Encounter: Payer: Self-pay | Admitting: *Deleted

## 2022-10-19 NOTE — Telephone Encounter (Signed)
Attempted to call patient in regards to an after-hours message received yesterday. No answer.  LVM on primary number requesting callback.

## 2022-10-19 NOTE — Telephone Encounter (Signed)
Patient returned call regarding pain from recent breast biopsy. Patient reports going to her PCP yesterday who assessed site and noted "lump" near nipple. Patient has been ordered antibiotics by PCP and is to undergo an ultrasound in the next few days. Dr. Mosetta Putt also aware of the situation and agrees with current interventions and recommends utilizing neosporin and heat at the site. Requested for patient to send picture of site via myChart portal.  Patient agreeable to try interventions. Patient notes chills and some dizziness, but remains afebrile at this time.  Patient encouraged to continue to take antibiotics as prescribed and to follow-up with PCP if she has not heard back about ultrasound. Patient knows to callback our office should she have any further questions or need any additional assistance.  All questions answered at time of call.

## 2022-10-20 ENCOUNTER — Other Ambulatory Visit: Payer: Self-pay | Admitting: Surgery

## 2022-10-20 ENCOUNTER — Encounter: Payer: Self-pay | Admitting: Hematology

## 2022-10-20 ENCOUNTER — Other Ambulatory Visit: Payer: Self-pay

## 2022-10-20 DIAGNOSIS — C50411 Malignant neoplasm of upper-outer quadrant of right female breast: Secondary | ICD-10-CM

## 2022-10-20 DIAGNOSIS — C50911 Malignant neoplasm of unspecified site of right female breast: Secondary | ICD-10-CM

## 2022-10-21 ENCOUNTER — Other Ambulatory Visit: Payer: Self-pay

## 2022-10-21 ENCOUNTER — Ambulatory Visit
Admission: RE | Admit: 2022-10-21 | Discharge: 2022-10-21 | Disposition: A | Discharge status: Home / Self Care | Payer: BC Managed Care – PPO | Source: Ambulatory Visit | Attending: Hematology | Admitting: Hematology

## 2022-10-21 DIAGNOSIS — C50411 Malignant neoplasm of upper-outer quadrant of right female breast: Secondary | ICD-10-CM

## 2022-10-23 ENCOUNTER — Other Ambulatory Visit: Payer: BC Managed Care – PPO

## 2022-10-25 ENCOUNTER — Encounter: Payer: Self-pay | Admitting: *Deleted

## 2022-11-01 ENCOUNTER — Telehealth: Payer: Self-pay | Admitting: *Deleted

## 2022-11-01 NOTE — Telephone Encounter (Signed)
"  Deborah Walter, [phone] (home) calling because I have short-term disability paperwork.  What do I need to do mail it to you?  Was written out of work by Dr. Magnus Ivan as the biopsy was awful on me.  Dr. Mosetta Putt also wrote me out and my PCP has as well.  Do I need to bring these letters because I do not need FMLA until after 11/13.  I have an appointment next Tuesday."  Reviewed [facility] Forms process.  Deborah Walter will email to [email or web address].  Aware of 7-10 business day (14-calendar) turnover.  Aware of [employer] form to sign, to complete and sign all employee sections.  Requested she sign authorization next week.  Staff will review form and contact her with questions after review of form and EMR.  "My appointment is at the [facility] but I can come by.office [phone] or [phone] afterwards."   Currently denies further questions or needs.  Awaiting receipt of disability form.

## 2022-11-03 ENCOUNTER — Telehealth: Payer: Self-pay

## 2022-11-03 ENCOUNTER — Other Ambulatory Visit: Payer: Self-pay

## 2022-11-03 NOTE — Telephone Encounter (Signed)
Pt called stating her company sent over short-term disability paperwork to Dr. Latanya Maudlin office to be completed while pt is currently out of work.  Pt stated she has been notified by her Human Resources at work that Dr. Latanya Maudlin office did not complete and return the paperwork; therefore, the pt's case was closed d/t paperwork was never completed and returned by Dr. Latanya Maudlin office.  Pt stated she is scheduled for surgery next week for her lumpectomy but is afraid she might lose her job if the paperwork is not completed.  Pt stated she's been out of work d/t complications from biopsy for over a month.  Dr. Mosetta Putt is aware of the pt being out of work.  Provided pt with the contact information for Dr. Latanya Maudlin FMLA/Disability Team.  Also, stated that this nurse will have the FMLA/Disability Team to contact her regarding the short-term disability paperwork.

## 2022-11-03 NOTE — Telephone Encounter (Signed)
Notified Patient of completion of Disability Form. Advised Patient that a Release of Information form is needed and will be emailed to her to sign. Patient stated that she would send the form she has from the Disability Company and she would return the [facility] Release of Information form by email. No other needs or concerns voiced at this time.

## 2022-11-04 ENCOUNTER — Other Ambulatory Visit: Payer: Self-pay

## 2022-11-04 ENCOUNTER — Encounter (HOSPITAL_BASED_OUTPATIENT_CLINIC_OR_DEPARTMENT_OTHER): Payer: Self-pay | Admitting: Surgery

## 2022-11-05 ENCOUNTER — Encounter (HOSPITAL_BASED_OUTPATIENT_CLINIC_OR_DEPARTMENT_OTHER)
Admission: RE | Admit: 2022-11-05 | Discharge: 2022-11-05 | Disposition: A | Discharge status: Home / Self Care | Payer: BC Managed Care – PPO | Source: Ambulatory Visit | Attending: Surgery | Admitting: Surgery

## 2022-11-05 ENCOUNTER — Ambulatory Visit: Payer: BC Managed Care – PPO | Admitting: Internal Medicine

## 2022-11-05 ENCOUNTER — Encounter: Payer: Self-pay | Admitting: Internal Medicine

## 2022-11-05 ENCOUNTER — Other Ambulatory Visit: Payer: Self-pay

## 2022-11-05 VITALS — BP 140/78 | HR 76 | Temp 101.2°F | Resp 18 | Ht 67.5 in | Wt 235.0 lb

## 2022-11-05 DIAGNOSIS — J3089 Other allergic rhinitis: Secondary | ICD-10-CM

## 2022-11-05 DIAGNOSIS — Z01810 Encounter for preprocedural cardiovascular examination: Secondary | ICD-10-CM | POA: Insufficient documentation

## 2022-11-05 DIAGNOSIS — R053 Chronic cough: Secondary | ICD-10-CM | POA: Diagnosis not present

## 2022-11-05 DIAGNOSIS — Z840 Family history of diseases of the skin and subcutaneous tissue: Secondary | ICD-10-CM | POA: Diagnosis not present

## 2022-11-05 MED ORDER — CHLORHEXIDINE GLUCONATE CLOTH 2 % EX PADS: 6.0000 | MEDICATED_PAD | Freq: Once | CUTANEOUS | Status: DC

## 2022-11-05 MED ORDER — FLUTICASONE PROPIONATE 50 MCG/ACT NA SUSP: 2.0000 | Freq: Every day | NASAL | 5 refills | Status: AC

## 2022-11-05 MED ORDER — ENSURE PRE-SURGERY PO LIQD: 296.0000 mL | Freq: Once | ORAL | Status: DC

## 2022-11-05 MED ORDER — LEVOCETIRIZINE DIHYDROCHLORIDE 5 MG PO TABS: 5.0000 mg | ORAL_TABLET | Freq: Every evening | ORAL | 5 refills | Status: DC

## 2022-11-05 NOTE — Progress Notes (Signed)

## 2022-11-05 NOTE — Patient Instructions (Addendum)
Other Allergic Rhinitis: - Use nasal saline rinses before nose sprays such as with Neilmed Sinus Rinse.  Use distilled water.   - Use Flonase 2 sprays each nostril daily. Aim upward and outward. - Use Xyzal 5mg  daily as needed for runny nose, sneezing, itchy watery eyes. Doree Barthel is short acting and comes with side effects.   Shortness of Breath/Cough - Spirometry today was normal.  Previous ones have also been normal. - Do not think you have asthma or require albuterol. - Stay off cigarette smoking.    Family hx of HAE - Will screen today with C1 esterase function.  Follow up: okay to overbook onto my schedule for skin testing when available after her surgery.  She will call back to do this once she figured out her other appointments and time availability.

## 2022-11-05 NOTE — Progress Notes (Signed)
NEW PATIENT  Date of Service/Encounter:  11/05/22  Consult requested by: Lindaann Slough, DO   Subjective:   Deborah Walter (DOB: 1960-05-02) is a 62 y.o. female who presents to the clinic on 11/05/2022 with a chief complaint of Angioedema and Other (Wants to gets tested for HAE.) .    History obtained from: chart review and patient.   HAE: Reports wanting to be tested for HAE.  Her brother was recently diagnosed.  Denies any history of recurrent swelling. Has had it previously with a drug allergy but never randomly.   SOB/Cough:  No diagnosis of asthma.  Reports intermittent shortness of breath especially when she has a lot of congestion and cough with post nasal drip.  Has an albuterol inhaler that she uses rarely; not sure if it helps. Does have a long standing smoking history but quit about 8 years ago. Denies any ER/urgent care visits/oral prednisone in the last year for breathing issues.  Has had several spirometry in 2019 in the past that never showed obstruction.   Rhinitis:  Started around age 29s.   Symptoms include: nasal congestion, rhinorrhea, and post nasal drainage with cough Occurs seasonally-Fall Potential triggers: not sure   Treatments tried:  Flonase PRN Benadryl; reports "reactions" to other anti histamines.   Previous allergy testing: yes; about 4 years ago, can't recall exact results  History of sinus surgery: septoplasty/turbinate reduction Nonallergic triggers: none  Past Medical History: Past Medical History:  Diagnosis Date   Acid reflux    Arthritis    osteoarthritis   Elevated cholesterol    Family history of adverse reaction to anesthesia    mother- hard to wake her up   H/O seasonal allergies    Hypertension    Urticaria    Past Surgical History: Past Surgical History:  Procedure Laterality Date   BREAST BIOPSY Right 10/11/2022   MM RT BREAST BX W LOC DEV 1ST LESION IMAGE BX SPEC STEREO GUIDE 10/11/2022 [facility]   CLAVICLE  SURGERY Right Early 1990   COLONOSCOPY WITH PROPOFOL N/A 02/27/2018   Procedure: COLONOSCOPY WITH PROPOFOL;  Surgeon: Corbin Ade, MD;  Location: [facility];  Service: Endoscopy;  Laterality: N/A;  8:30am   CYSTECTOMY  Early 1990   Bottom of spine   NASAL SEPTOPLASTY W/ TURBINOPLASTY Bilateral 09/01/2015   Procedure: NASAL SEPTOPLASTY WITH TURBINATE REDUCTION;  Surgeon: Newman Pies, MD;  Location: [facility];  Service: ENT;  Laterality: Bilateral;   POLYPECTOMY  02/27/2018   Procedure: POLYPECTOMY;  Surgeon: Corbin Ade, MD;  Location: [facility];  Service: Endoscopy;;  hepatic flexure, rectum   SINOSCOPY     SINUS ENDO WITH FUSION Left 09/01/2015   Procedure: LEFT ENDOSCOPIC TOTAL ETHMOIDECTOMY LEFT ENDOSCOPIC  MAXILLARY ANTROSTOMY LEFT ENDOSCOPIC FRONTAL RECESS EXPLORATION;  Surgeon: Newman Pies, MD;  Location: [facility];  Service: ENT;  Laterality: Left;   TOTAL SHOULDER ARTHROPLASTY Right 11/09/2018   Procedure: TOTAL SHOULDER ARTHROPLASTY;  Surgeon: Jones Broom, MD;  Location: [facility];  Service: Orthopedics;  Laterality: Right;   TUBAL LIGATION  1982    Family History: Family History  Problem Relation Age of Onset   Asthma Father    Asthma Sister    Cancer Paternal Aunt        LUNG CANCER   Eczema Neg Hx    Immunodeficiency Neg Hx    Urticaria Neg Hx    Angioedema Neg Hx    Atopy Neg Hx  Colon cancer Neg Hx    Colon polyps Neg Hx     Social History:  Flooring in bedroom: wood Pets: cat Tobacco use/exposure: 30 years, highest was 3ppd; quit about 8 years ago Job: Location manager   Medication List:  Allergies as of 11/05/2022       Reactions   Penicillins Swelling   Facial swelling and severe itching   Atorvastatin Other (See Comments)   Diclofenac    Caused dry mouth   Dust Mite Extract    Gramineae Pollens    unknown   Other    ALMONDS.  CAUSE MIGRAINE HEADACHE unknown   Pravastatin Other (See Comments)   Amoxicillin  Swelling   Facial swelling and severe itching   Cefuroxime Rash   Tizanidine Hcl Palpitations        Medication List        Accurate as of November 05, 2022 10:44 AM. If you have any questions, ask your nurse or doctor.          STOP taking these medications    aerochamber plus with mask inhaler Stopped by: Birder Robson   famotidine 40 MG tablet Commonly known as: PEPCID Stopped by: Birder Robson   THERATEARS OP Stopped by: Birder Robson   tiZANidine 4 MG tablet Commonly known as: Zanaflex Stopped by: Birder Robson       TAKE these medications    Biotin 10 MG Caps Take 10,000 capsules by mouth daily.   ciclopirox 8 % solution Commonly known as: PENLAC Apply 1 Application topically at bedtime.   ezetimibe 10 MG tablet Commonly known as: ZETIA Take 10 mg by mouth daily.   Fish Oil 1000 MG Caps Take by mouth.   fluticasone 50 MCG/ACT nasal spray Commonly known as: FLONASE Place 1 spray into both nostrils 2 (two) times daily as needed for allergies.   lisinopril-hydrochlorothiazide 20-25 MG tablet Commonly known as: ZESTORETIC Take 1 tablet by mouth daily.   omeprazole 20 MG capsule Commonly known as: PRILOSEC Take 20 mg by mouth daily.   oxyCODONE-acetaminophen 5-325 MG tablet Commonly known as: Percocet Take 1-2 tablets every 4 hours as needed for post operative pain. MAX 6/day   ProAir HFA 108 (90 Base) MCG/ACT inhaler Generic drug: albuterol INHALE 2 PUFFS BY MOUTH EVERY 6 HOURS AS NEEDED FOR WHEEZING FOR SHORTNESS OF BREATH   vitamin B-12 100 MCG tablet Commonly known as: CYANOCOBALAMIN Take 100 mcg by mouth daily.   Vitamin C 125 MG Chew Chew by mouth.   Vitamin D3 250 MCG (10000 UT) capsule Take 10,000 Units by mouth daily.         REVIEW OF SYSTEMS: Pertinent positives and negatives discussed in HPI.   Objective:   Physical Exam: BP (!) 140/78 (BP Location: Right Leg, Patient Position: Sitting, Cuff Size: Normal)    Pulse 76   Temp (!) 101.2 F (38.4 C) (Temporal)   Resp 18   Ht 5' 7.5" (1.715 m)   Wt 235 lb (106.6 kg)   SpO2 97%   BMI 36.26 kg/m  Body mass index is 36.26 kg/m. GEN: alert, well developed HEENT: clear conjunctiva, TM grey and translucent, nose with + mild inferior turbinate hypertrophy, pink nasal mucosa, slight clear rhinorrhea, + cobblestoning HEART: regular rate and rhythm, no murmur LUNGS: clear to auscultation bilaterally, no coughing, unlabored respiration ABDOMEN: soft, non distended  SKIN: no rashes or lesions  Reviewed:  10/12/2022: seen by Dr Jules Husbands PCP for HLD, intraductal papilloma, onychomycosis.  On ciclopirox, zetia, fish oil.   03/14/2019: seen by Dr Dellis Anes for SOB/allergic rhinitis/swelling/pruritus.  Spirometry did not show obstruction in the past.  Discussed use of Qvar/Albuterol.  Also on Flonase, Allegra, Hydroxyzine. In the past, swelling responded to antibiotics-clindamycin.    09/21/2022: seen by Dr Mosetta Putt for bl breast cancer, invasive carcinoma, ER/PR+ and HER2-. Planning for biopsy. Discussed antiestrogen therapy for 5-10 years.  Consider lumpectomy/radiation.   Spirometry:  Tracings reviewed. Her effort: Good reproducible efforts. FVC: 3.16L FEV1: 2.58L, 98% predicted FEV1/FVC ratio: 82% Interpretation: Spirometry consistent with normal pattern.  Please see scanned spirometry results for details.   Assessment:   1. Other allergic rhinitis   2. Chronic cough   3. Family history of angioedema     Plan/Recommendations:  Other Allergic Rhinitis: - Due to turbinate hypertrophy, seasonal symptoms and unresponsive to over the counter meds, will perform skin testing to identify aeroallergen triggers.   - Use nasal saline rinses before nose sprays such as with Neilmed Sinus Rinse.  Use distilled water.   - Use Flonase 2 sprays each nostril daily. Aim upward and outward. - Use Xyzal 5mg  daily as needed for runny nose, sneezing, itchy watery eyes. Doree Barthel  is short acting and comes with side effects.   Shortness of Breath/Cough - Spirometry today was normal.  Previous ones have also been normal.  Do not think you have asthma or require albuterol.  I do wonder if symptoms are related to uncontrolled upper airway.  - Stay off cigarette smoking.   - If persistent, did discuss risk of COPD with her long standing history of tobacco use and consider Pulm evaluation.   Family hx of HAE - No sxs of angioedema, low suspicion for HAE but she wishes to test to be sure. Will screen today with C1 esterase function.  Follow up: okay to overbook onto my schedule for skin testing when available after her surgery.  She will call back to do this once she figured out her other appointments and time availability. Must hold anti histamines 3 days prior.   Return if symptoms worsen or fail to improve.  Alesia Morin, MD [facility]

## 2022-11-09 ENCOUNTER — Other Ambulatory Visit: Payer: Self-pay | Admitting: Surgery

## 2022-11-09 ENCOUNTER — Ambulatory Visit
Admission: RE | Admit: 2022-11-09 | Discharge: 2022-11-09 | Disposition: A | Discharge status: Home / Self Care | Payer: BC Managed Care – PPO | Source: Ambulatory Visit | Attending: Surgery | Admitting: Surgery

## 2022-11-09 DIAGNOSIS — C50911 Malignant neoplasm of unspecified site of right female breast: Secondary | ICD-10-CM

## 2022-11-09 HISTORY — PX: BREAST BIOPSY: SHX20

## 2022-11-09 NOTE — H&P (Signed)
REFERRING PHYSICIAN: Adah Salvage* PROVIDER: Wayne Both, MD MRN: [id number] DOB: 1960/06/21  Subjective   Chief Complaint: New Consultation and Breast Problem  History of Present Illness: Deborah Walter is a 62 y.o. female who is seen today as an office consultation for evaluation of New Consultation and Breast Problem  This is a 62 year old female who was found on recent screen mammography to have abnormalities in both her breast. She was found to have an 8 mm mass in the left breast at the 12:30 position and a 6 mm mass at the 1 o'clock position. Biopsy of both showed invasive mammary carcinoma as well as mammary carcinoma in situ. In the right breast she had a 1.5 cm intraductal mass at the 8 o'clock position behind the areola and a separate 6 mm mass in the right breast at the 10 o'clock position. Biopsy of the 8:00 mass showed intraductal papilloma. Biopsy of the 10:00 mass showed invasive ductal carcinoma with ductal carcinoma in situ.  There are additional calcifications in the right breast which were not biopsied.  She has had no previous problems regarding her breast. She denies nipple discharge. There is no family history of breast cancer. She is otherwise healthy and without complaints.  Review of Systems: A complete review of systems was obtained from the patient. I have reviewed this information and discussed as appropriate with the patient. See HPI as well for other ROS.  ROS   Medical History: Past Medical History:  Diagnosis Date  GERD (gastroesophageal reflux disease)  History of cancer   There is no problem list on file for this patient.  Past Surgical History:  Procedure Laterality Date  shoulder surgery    Allergies  Allergen Reactions  Penicillins Swelling  Facial swelling and severe itching  Diclofenac Unknown  Caused dry mouth  Atorvastatin Muscle Pain and Other (See Comments)  Cefuroxime Rash  Pravastatin Muscle Pain and Hives   Tizanidine Hcl Palpitations   Current Outpatient Medications on File Prior to Visit  Medication Sig Dispense Refill  albuterol MDI, PROVENTIL, VENTOLIN, PROAIR, HFA 90 mcg/actuation inhaler INHALE 2 PUFFS BY MOUTH EVERY 6 HOURS AS NEEDED FOR WHEEZING AND FOR SHORTNESS OF BREATH  fluticasone propionate (FLONASE) 50 mcg/actuation nasal spray Place 1 spray into both nostrils once daily  lisinopriL-hydroCHLOROthiazide (ZESTORETIC) 20-25 mg tablet Take 1 tablet by mouth once daily  naproxen (NAPROSYN) 500 MG tablet Take 1 tablet by mouth 2 (two) times daily as needed  omeprazole (PRILOSEC) 20 MG DR capsule Take 1 capsule by mouth once daily   No current facility-administered medications on file prior to visit.   Family History  Problem Relation Age of Onset  Coronary Artery Disease (Blocked arteries around heart) Mother  Diabetes Mother  High blood pressure (Hypertension) Father    Social History   Tobacco Use  Smoking Status Former  Types: Cigarettes  Start date: 2017  Smokeless Tobacco Never    Social History   Socioeconomic History  Marital status: Single  Tobacco Use  Smoking status: Former  Types: Cigarettes  Start date: 2017  Smokeless tobacco: Never  Substance and Sexual Activity  Alcohol use: Defer  Drug use: Defer   Social Determinants of Health   Financial Resource Strain: Low Risk (07/28/2022)  Received from [facility]  Overall Financial Resource Strain (CARDIA)  Difficulty of Paying Living Expenses: Not hard at all  Food Insecurity: No Food Insecurity (07/28/2022)  Received from [facility]  Hunger Vital Sign  Worried About  Running Out of Food in the Last Year: Never true  Ran Out of Food in the Last Year: Never true  Transportation Needs: No Transportation Needs (07/28/2022)  Received from [facility] - Transportation  Lack of Transportation (Medical): No  Lack of Transportation (Non-Medical): No  Physical Activity: Sufficiently  Active (07/28/2022)  Received from [facility]  Exercise Vital Sign  Days of Exercise per Week: 4 days  Minutes of Exercise per Session: 40 min  Stress: No Stress Concern Present (07/28/2022)  Received from [facility] - Occupational Stress Questionnaire  Feeling of Stress : Not at all  Social Connections: Socially Integrated (07/28/2022)  [facility] from [facility]  Social Connection and Isolation Panel [NHANES]  Frequency of Communication with Friends and Family: Three times a week  Frequency of Social Gatherings with Friends and Family: Twice a week  Attends Religious Services: 1 to 4 times per year  Active Member of [employer] or Organizations: No  Attends Engineer, structural: 1 to 4 times per year  Marital Status: Married   Objective:   Vitals:    BP: (!) 147/89  Pulse: 93  Temp: 37.2 C (99 F)  SpO2: 96%  Weight: (!) 103.4 kg (228 lb)  Height: 177.8 cm (5\' 10" )   Body mass index is 32.71 kg/m.  Physical Exam   She appears well on exam.  Breast exam bilaterally and there are no palpable masses in either breast. There is no axillary adenopathy on either side.  Labs, Imaging and Diagnostic Testing: I have reviewed her mammograms, ultrasound, and pathology results.  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 Given the multiple areas of cancer in the left breast as well as the cancer in the right with calcifications, bilateral breast MRI strongly recommended by both radiology and myself to evaluate the extent of disease and whether or not she could be a candidate for breast conservation  surgery versus bilateral mastectomies. I discussed this with her in detail. I discussed the multidisciplinary approach to breast cancer. We will refer her to both medical and radiation oncology and we will schedule the MRI. Should she need bilateral mastectomy she is interested in immediate reconstruction. I also discussed that at the time of surgery she would need sentinel lymph node biopsies on both sides as well. Again, all surgical decisions will be made once the MRI is performed and we know if other biopsies may be needed. We are also awaiting the receptor status on the final pathology from both breast cancers. After a long discussion, she agrees with plans. I will try to call her back with the results of the MRI and again she will be contacted by the cancer center as well.  Addendum: I have discussed all the bilateral biopsy results with Ms. Kuechenmeister. We again discussed breast conservation with bilateral lumpectomies versus bilateral mastectomies. She would still need lymph node biopsies on both sides. 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 She would need to radioactive seed placed on this side for breast conservation. On the left  side she has an area of invasive ductal carcinoma at the 12:30 position, an area of invasive ductal carcinoma of the 1 o'clock position, and DCIS of the 2 o'clock position. This is a larger area which she was still like me to try lumpectomy as well so this would need 3 seeds placed as well. I again explained the surgical procedure to her in detail. We discussed the risks which includes but is not limited to bleeding, infection, injury to surrounding structures, the need for further surgery if margins are positive, the potential for cosmetic defect on the left side based on the size of the lumpectomy, cardiopulmonary issues with anesthesia, DVT, etc. We discussed the benign papillomas and she would  like to hold on removal of these. I believe this is reasonable. I have filled out orders for surgery which will be scheduled

## 2022-11-10 ENCOUNTER — Ambulatory Visit (HOSPITAL_BASED_OUTPATIENT_CLINIC_OR_DEPARTMENT_OTHER): Payer: BC Managed Care – PPO | Admitting: Anesthesiology

## 2022-11-10 ENCOUNTER — Encounter: Payer: Self-pay | Admitting: Nurse Practitioner

## 2022-11-10 ENCOUNTER — Ambulatory Visit
Admission: RE | Admit: 2022-11-10 | Discharge: 2022-11-10 | Disposition: A | Discharge status: Home / Self Care | Payer: BC Managed Care – PPO | Source: Ambulatory Visit | Attending: Surgery | Admitting: Surgery

## 2022-11-10 ENCOUNTER — Other Ambulatory Visit: Payer: Self-pay

## 2022-11-10 ENCOUNTER — Ambulatory Visit (HOSPITAL_BASED_OUTPATIENT_CLINIC_OR_DEPARTMENT_OTHER)
Admission: RE | Admit: 2022-11-10 | Discharge: 2022-11-10 | Disposition: A | Discharge status: Home / Self Care | Payer: BC Managed Care – PPO | Attending: Surgery | Admitting: Surgery

## 2022-11-10 ENCOUNTER — Other Ambulatory Visit: Payer: Self-pay | Admitting: Surgery

## 2022-11-10 ENCOUNTER — Encounter (HOSPITAL_BASED_OUTPATIENT_CLINIC_OR_DEPARTMENT_OTHER): Payer: Self-pay | Admitting: Surgery

## 2022-11-10 ENCOUNTER — Encounter (HOSPITAL_BASED_OUTPATIENT_CLINIC_OR_DEPARTMENT_OTHER)
Admission: RE | Disposition: A | Discharge status: Home / Self Care | Payer: Self-pay | Source: Home / Self Care | Attending: Surgery

## 2022-11-10 DIAGNOSIS — Z87891 Personal history of nicotine dependence: Secondary | ICD-10-CM | POA: Diagnosis not present

## 2022-11-10 DIAGNOSIS — C50911 Malignant neoplasm of unspecified site of right female breast: Secondary | ICD-10-CM

## 2022-11-10 DIAGNOSIS — D0502 Lobular carcinoma in situ of left breast: Secondary | ICD-10-CM | POA: Diagnosis not present

## 2022-11-10 DIAGNOSIS — N6091 Unspecified benign mammary dysplasia of right breast: Secondary | ICD-10-CM | POA: Diagnosis not present

## 2022-11-10 DIAGNOSIS — D0511 Intraductal carcinoma in situ of right breast: Secondary | ICD-10-CM | POA: Diagnosis present

## 2022-11-10 DIAGNOSIS — K219 Gastro-esophageal reflux disease without esophagitis: Secondary | ICD-10-CM | POA: Diagnosis not present

## 2022-11-10 DIAGNOSIS — Z01818 Encounter for other preprocedural examination: Secondary | ICD-10-CM

## 2022-11-10 HISTORY — PX: BREAST LUMPECTOMY WITH RADIOACTIVE SEED AND SENTINEL LYMPH NODE BIOPSY: SHX6550

## 2022-11-10 LAB — C1 ESTERASE INHIBITOR, FUNCTIONAL: C1INH Functional/C1INH Total MFr SerPl: >110 %{normal}

## 2022-11-10 SURGERY — BREAST LUMPECTOMY WITH RADIOACTIVE SEED AND SENTINEL LYMPH NODE BIOPSY
Anesthesia: General | Site: Breast | Laterality: Bilateral

## 2022-11-10 MED ORDER — ONDANSETRON HCL 4 MG/2ML IJ SOLN
INTRAMUSCULAR | Status: DC | PRN
  Administered 2022-11-10: 4 mg via INTRAVENOUS

## 2022-11-10 MED ORDER — ACETAMINOPHEN 500 MG PO TABS: 1000.0000 mg | ORAL_TABLET | ORAL | Status: DC

## 2022-11-10 MED ORDER — LIDOCAINE HCL (CARDIAC) PF 100 MG/5ML IV SOSY
PREFILLED_SYRINGE | INTRAVENOUS | Status: DC | PRN
  Administered 2022-11-10: 60 mg via INTRAVENOUS

## 2022-11-10 MED ORDER — MAGTRACE LYMPHATIC TRACER
INTRAMUSCULAR | Status: DC | PRN
  Administered 2022-11-10: 3 mL via INTRAMUSCULAR

## 2022-11-10 MED ORDER — ACETAMINOPHEN 325 MG PO TABS
ORAL_TABLET | ORAL | Status: AC
  Filled 2022-11-10: qty 2

## 2022-11-10 MED ORDER — BUPIVACAINE-EPINEPHRINE 0.5% -1:200000 IJ SOLN
INTRAMUSCULAR | Status: DC | PRN
  Administered 2022-11-10: 30 mL

## 2022-11-10 MED ORDER — PROPOFOL 10 MG/ML IV BOLUS
INTRAVENOUS | Status: DC | PRN
  Administered 2022-11-10: 180 mg via INTRAVENOUS

## 2022-11-10 MED ORDER — LIDOCAINE 2% (20 MG/ML) 5 ML SYRINGE
INTRAMUSCULAR | Status: AC
  Filled 2022-11-10: qty 5

## 2022-11-10 MED ORDER — FENTANYL CITRATE (PF) 100 MCG/2ML IJ SOLN
INTRAMUSCULAR | Status: AC
  Filled 2022-11-10: qty 2

## 2022-11-10 MED ORDER — FENTANYL CITRATE (PF) 100 MCG/2ML IJ SOLN
INTRAMUSCULAR | Status: DC | PRN
  Administered 2022-11-10: 25 ug via INTRAVENOUS
  Administered 2022-11-10: 50 ug via INTRAVENOUS
  Administered 2022-11-10: 25 ug via INTRAVENOUS
  Administered 2022-11-10: 100 ug via INTRAVENOUS

## 2022-11-10 MED ORDER — ONDANSETRON HCL 4 MG/2ML IJ SOLN: 4.0000 mg | Freq: Four times a day (QID) | INTRAMUSCULAR | Status: DC | PRN

## 2022-11-10 MED ORDER — FENTANYL CITRATE (PF) 100 MCG/2ML IJ SOLN: 25.0000 ug | INTRAMUSCULAR | Status: DC | PRN

## 2022-11-10 MED ORDER — ACETAMINOPHEN 325 MG PO TABS
650.0000 mg | ORAL_TABLET | Freq: Once | ORAL | Status: AC
  Administered 2022-11-10: 650 mg via ORAL

## 2022-11-10 MED ORDER — ACETAMINOPHEN 500 MG PO TABS
ORAL_TABLET | ORAL | Status: AC
  Filled 2022-11-10: qty 2

## 2022-11-10 MED ORDER — LACTATED RINGERS IV SOLN: INTRAVENOUS | Status: DC

## 2022-11-10 MED ORDER — OXYCODONE HCL 5 MG/5ML PO SOLN: 5.0000 mg | Freq: Once | ORAL | Status: AC | PRN

## 2022-11-10 MED ORDER — OXYCODONE HCL 5 MG PO TABS
ORAL_TABLET | ORAL | Status: AC
  Filled 2022-11-10: qty 1

## 2022-11-10 MED ORDER — DEXAMETHASONE SODIUM PHOSPHATE 10 MG/ML IJ SOLN
INTRAMUSCULAR | Status: AC
  Filled 2022-11-10: qty 1

## 2022-11-10 MED ORDER — CIPROFLOXACIN IN D5W 400 MG/200ML IV SOLN
400.0000 mg | INTRAVENOUS | Status: AC
  Administered 2022-11-10: 400 mg via INTRAVENOUS

## 2022-11-10 MED ORDER — DEXAMETHASONE SODIUM PHOSPHATE 4 MG/ML IJ SOLN
INTRAMUSCULAR | Status: DC | PRN
  Administered 2022-11-10: 5 mg via INTRAVENOUS

## 2022-11-10 MED ORDER — PROPOFOL 10 MG/ML IV BOLUS
INTRAVENOUS | Status: AC
  Filled 2022-11-10: qty 20

## 2022-11-10 MED ORDER — ROPIVACAINE HCL 5 MG/ML IJ SOLN
INTRAMUSCULAR | Status: DC | PRN
Start: 2022-11-10 — End: 2022-11-10
  Administered 2022-11-10 (×2): 20 mL via PERINEURAL

## 2022-11-10 MED ORDER — CIPROFLOXACIN IN D5W 400 MG/200ML IV SOLN
INTRAVENOUS | Status: AC
  Filled 2022-11-10: qty 200

## 2022-11-10 MED ORDER — FENTANYL CITRATE (PF) 100 MCG/2ML IJ SOLN
50.0000 ug | Freq: Once | INTRAMUSCULAR | Status: AC
  Administered 2022-11-10: 50 ug via INTRAVENOUS

## 2022-11-10 MED ORDER — OXYCODONE HCL 5 MG PO TABS
5.0000 mg | ORAL_TABLET | Freq: Once | ORAL | Status: AC | PRN
  Administered 2022-11-10: 5 mg via ORAL

## 2022-11-10 MED ORDER — MIDAZOLAM HCL 2 MG/2ML IJ SOLN
INTRAMUSCULAR | Status: AC
  Filled 2022-11-10: qty 2

## 2022-11-10 MED ORDER — MIDAZOLAM HCL 2 MG/2ML IJ SOLN
2.0000 mg | Freq: Once | INTRAMUSCULAR | Status: AC
  Administered 2022-11-10: 2 mg via INTRAVENOUS

## 2022-11-10 SURGICAL SUPPLY — 44 items
ADH SKN CLS APL DERMABOND .7 (GAUZE/BANDAGES/DRESSINGS) ×1
APL PRP STRL LF DISP 70% ISPRP (MISCELLANEOUS) ×1
APPLIER CLIP 9.375 MED OPEN (MISCELLANEOUS) ×2
APR CLP MED 9.3 20 MLT OPN (MISCELLANEOUS) ×2
BINDER BREAST XXLRG (GAUZE/BANDAGES/DRESSINGS) IMPLANT
BLADE SURG 15 STRL LF DISP TIS (BLADE) ×1 IMPLANT
BLADE SURG 15 STRL SS (BLADE) ×1
CANISTER SUCT 1200ML W/VALVE (MISCELLANEOUS) IMPLANT
CHLORAPREP W/TINT 26 (MISCELLANEOUS) ×1 IMPLANT
CLIP APPLIE 9.375 MED OPEN (MISCELLANEOUS) ×1 IMPLANT
CLIP TI WIDE RED SMALL 6 (CLIP) IMPLANT
COVER BACK TABLE 60X90IN (DRAPES) ×1 IMPLANT
COVER MAYO STAND STRL (DRAPES) ×1 IMPLANT
COVER PROBE CYLINDRICAL 5X96 (MISCELLANEOUS) ×1 IMPLANT
DERMABOND ADVANCED .7 DNX12 (GAUZE/BANDAGES/DRESSINGS) ×1 IMPLANT
DRAPE LAPAROSCOPIC ABDOMINAL (DRAPES) ×1 IMPLANT
DRAPE UTILITY XL STRL (DRAPES) ×1 IMPLANT
ELECT REM PT RETURN 9FT ADLT (ELECTROSURGICAL) ×1
ELECTRODE REM PT RTRN 9FT ADLT (ELECTROSURGICAL) ×1 IMPLANT
GAUZE SPONGE 4X4 12PLY STRL LF (GAUZE/BANDAGES/DRESSINGS) IMPLANT
GLOVE SURG SIGNA 7.5 PF LTX (GLOVE) ×1 IMPLANT
GOWN STRL REUS W/ TWL LRG LVL3 (GOWN DISPOSABLE) ×1 IMPLANT
GOWN STRL REUS W/ TWL XL LVL3 (GOWN DISPOSABLE) ×1 IMPLANT
GOWN STRL REUS W/TWL LRG LVL3 (GOWN DISPOSABLE) ×1
GOWN STRL REUS W/TWL XL LVL3 (GOWN DISPOSABLE) ×1
KIT MARKER MARGIN INK (KITS) ×1 IMPLANT
NDL HYPO 25X1 1.5 SAFETY (NEEDLE) ×1 IMPLANT
NDL SAFETY ECLIPSE 18X1.5 (NEEDLE) ×1 IMPLANT
NEEDLE HYPO 25X1 1.5 SAFETY (NEEDLE) ×1
NS IRRIG 1000ML POUR BTL (IV SOLUTION) ×1 IMPLANT
PACK BASIN DAY SURGERY FS (CUSTOM PROCEDURE TRAY) ×1 IMPLANT
PENCIL SMOKE EVACUATOR (MISCELLANEOUS) ×1 IMPLANT
SLEEVE SCD COMPRESS KNEE MED (STOCKING) ×1 IMPLANT
SPONGE T-LAP 4X18 ~~LOC~~+RFID (SPONGE) ×1 IMPLANT
SUT MNCRL AB 4-0 PS2 18 (SUTURE) ×1 IMPLANT
SUT SILK 2 0 SH (SUTURE) IMPLANT
SUT VIC AB 3-0 SH 27 (SUTURE) ×1
SUT VIC AB 3-0 SH 27X BRD (SUTURE) ×1 IMPLANT
SYR CONTROL 10ML LL (SYRINGE) ×1 IMPLANT
TOWEL GREEN STERILE FF (TOWEL DISPOSABLE) ×1 IMPLANT
TRACER MAGTRACE VIAL (MISCELLANEOUS) IMPLANT
TRAY FAXITRON CT DISP (TRAY / TRAY PROCEDURE) ×1 IMPLANT
TUBE CONNECTING 20X1/4 (TUBING) IMPLANT
YANKAUER SUCT BULB TIP NO VENT (SUCTIONS) IMPLANT

## 2022-11-10 NOTE — Discharge Instructions (Addendum)
Took tylenol at 1050. May take next dose 6p. Took Oxycodone at 3:30p. May repeat dose at 6pm as needed.    Post Anesthesia Home Care Instructions  Activity: Get plenty of rest for the remainder of the day. A responsible individual must stay with you for 24 hours following the procedure.  For the next 24 hours, DO NOT: -Drive a car -Advertising copywriter -Drink alcoholic beverages -Take any medication unless instructed by your physician -Make any legal decisions or sign important papers.  Meals: Start with liquid foods such as gelatin or soup. Progress to regular foods as tolerated. Avoid greasy, spicy, heavy foods. If nausea and/or vomiting occur, drink only clear liquids until the nausea and/or vomiting subsides. Call your physician if vomiting continues.  Special Instructions/Symptoms: Your throat may feel dry or sore from the anesthesia or the breathing tube placed in your throat during surgery. If this causes discomfort, gargle with warm salt water. The discomfort should disappear within 24 hours.  If you had a scopolamine patch placed behind your ear for the management of post- operative nausea and/or vomiting:  1. The medication in the patch is effective for 72 hours, after which it should be removed.  Wrap patch in a tissue and discard in the trash. Wash hands thoroughly with soap and water. 2. You may remove the patch earlier than 72 hours if you experience unpleasant side effects which may include dry mouth, dizziness or visual disturbances. 3. Avoid touching the patch. Wash your hands with soap and water after contact with the patch.    Next dose of Tylenol may be taken at 6p    [employer] Office Phone Number [phone]  BREAST BIOPSY/ PARTIAL MASTECTOMY: POST OP INSTRUCTIONS  Always review your discharge instruction sheet given to you by the facility where your surgery was performed.  IF YOU HAVE DISABILITY OR FAMILY LEAVE FORMS, YOU MUST BRING THEM TO  THE OFFICE FOR PROCESSING.  DO NOT GIVE THEM TO YOUR DOCTOR.  A prescription for pain medication may be given to you upon discharge.  Take your pain medication as prescribed, if needed.  If narcotic pain medicine is not needed, then you may take acetaminophen (Tylenol) or ibuprofen (Advil) as needed. Take your usually prescribed medications unless otherwise directed If you need a refill on your pain medication, please contact your pharmacy.  They will contact our office to request authorization.  Prescriptions will not be filled after 5pm or on week-ends. You should eat very light the first 24 hours after surgery, such as soup, crackers, pudding, etc.  Resume your normal diet the day after surgery. Most patients will experience some swelling and bruising in the breast.  Ice packs and a good support bra will help.  Swelling and bruising can take several days to resolve.  It is common to experience some constipation if taking pain medication after surgery.  Increasing fluid intake and taking a stool softener will usually help or prevent this problem from occurring.  A mild laxative (Milk of Magnesia or Miralax) should be taken according to package directions if there are no bowel movements after 48 hours. Unless discharge instructions indicate otherwise, you may remove your bandages 24-48 hours after surgery, and you may shower at that time.  You may have steri-strips (small skin tapes) in place directly over the incision.  These strips should be left on the skin for 7-10 days.  If your surgeon used skin glue on the incision, you may shower in 24 hours.  The glue will flake off over the next 2-3 weeks.  Any sutures or staples will be removed at the office during your follow-up visit. ACTIVITIES:  You may resume regular daily activities (gradually increasing) beginning the next day.  Wearing a good support bra or sports bra minimizes pain and swelling.  You may have sexual intercourse when it is  comfortable. You may drive when you no longer are taking prescription pain medication, you can comfortably wear a seatbelt, and you can safely maneuver your car and apply brakes. RETURN TO WORK:  ______________________________________________________________________________________ Deborah Walter should see your doctor in the office for a follow-up appointment approximately two weeks after your surgery.  Your doctor's nurse will typically make your follow-up appointment when she calls you with your pathology report.  Expect your pathology report 2-3 business days after your surgery.  You may call to check if you do not hear from [place] after three days. OTHER INSTRUCTIONS: YOU MAY REMOVE THE BINDER AND SHOWER STARTING TOMORROW AND THEN PUT THE BINDER BACK ON ICE PACK, TYLENOL, AND IBUPROFEN ALSO FOR PAIN __NO Deborah Walter ACTIVITY FOR ONE WEEK _____________________________________________________________________________________________ _____________________________________________________________________________________________________________________________________ _____________________________________________________________________________________________________________________________________ _____________________________________________________________________________________________________________________________________  WHEN TO CALL YOUR DOCTOR: Fever over 101.0 Nausea and/or vomiting. Extreme swelling or bruising. Continued bleeding from incision. Increased pain, redness, or drainage from the incision.  The clinic staff is available to answer your questions during regular business hours.  Please don't hesitate to call and ask to speak to one of the nurses for clinical concerns.  If you have a medical emergency, go to the nearest emergency room or call 911.  A surgeon from [facility] is always on call at the hospital.  For further questions, please visit [email or web address]

## 2022-11-10 NOTE — Interval H&P Note (Signed)
History and Physical Interval Note:no change in H and P  11/10/2022 12:12 PM  Deborah Walter  has presented today for surgery, with the diagnosis of BILATERAL BREAST CANCER.  The various methods of treatment have been discussed with the patient and family. After consideration of risks, benefits and other options for treatment, the patient has consented to  Procedure(s) with comments: BILATERAL RADIOACTIVE SEED GUIDED LUMPECTOMY WITH BILATERAL SENTINEL NODE BIOPSY (Bilateral) - LMA PEC BLOCK as a surgical intervention.  The patient's history has been reviewed, patient examined, no change in status, stable for surgery.  I have reviewed the patient's chart and labs.  Questions were answered to the patient's satisfaction.     Abigail Miyamoto

## 2022-11-10 NOTE — Progress Notes (Signed)
Assisted Dr. Chaney Malling with left, right, pectoralis, ultrasound guided block. Side rails up, monitors on throughout procedure. See vital signs in flow sheet. Tolerated Procedure well.

## 2022-11-10 NOTE — Anesthesia Procedure Notes (Signed)
Procedure Name: LMA Insertion Date/Time: 11/10/2022 12:27 PM  Performed by: Burna Cash, CRNAPre-anesthesia Checklist: Patient identified, Emergency Drugs available, Suction available and Patient being monitored Patient Re-evaluated:Patient Re-evaluated prior to induction Oxygen Delivery Method: Circle system utilized Preoxygenation: Pre-oxygenation with 100% oxygen Induction Type: IV induction Ventilation: Mask ventilation without difficulty LMA: LMA inserted LMA Size: 4.0 Number of attempts: 1 Airway Equipment and Method: Bite block Placement Confirmation: positive ETCO2 Tube secured with: Tape Dental Injury: Teeth and Oropharynx as per pre-operative assessment

## 2022-11-10 NOTE — Anesthesia Procedure Notes (Signed)
Anesthesia Regional Block: Pectoralis block   Pre-Anesthetic Checklist: , timeout performed,  Correct Patient, Correct Site, Correct Laterality,  Correct Procedure, Correct Position, site marked,  Risks and benefits discussed,  Surgical consent,  Pre-op evaluation,  At surgeon's request and post-op pain management  Laterality: Right  Prep: chloraprep       Needles:  Injection technique: Single-shot  Needle Type: Echogenic Needle     Needle Length: 9cm  Needle Gauge: 21     Additional Needles:   Narrative:  Start time: 11/10/2022 11:37 AM End time: 11/10/2022 11:43 AM Injection made incrementally with aspirations every 5 mL.  Performed by: Personally  Anesthesiologist: Achille Rich, MD  Additional Notes: Pt tolerated the procedure well.

## 2022-11-10 NOTE — Op Note (Signed)
BILATERAL RADIOACTIVE SEED GUIDED LUMPECTOMY WITH BILATERAL SENTINEL NODE BIOPSY  Procedure Note  Deborah Walter 11/10/2022   Pre-op Diagnosis: BILATERAL BREAST CANCER     Post-op Diagnosis: same  Procedure(s): BILATERAL RADIOACTIVE SEED GUIDED LUMPECTOMY   BILATERAL DEEP AXILLARY SENTINEL LYMPH NODE BIOPSY INJECTION OF MAG TRACE BILATERALLY FOR LYMPH NODE MAPPING  Surgeon(s): Abigail Miyamoto, MD  Anesthesia: General  Staff:  Circulator: Maryan Rued, RN Relief Circulator: Raliegh Scarlet, RN Scrub Person: Efrain Sella, RN  Estimated Blood Loss: Minimal               Specimens: sent to path  Indications: This is a 62 year old female who was found to have bilateral breast cancers.  There were 3 separate areas in the upper outer quadrant of the left breast and 1 area in the right breast.  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  After long discussion regarding breast conservation versus mastectomy she wished to proceed with breast conservation.  Procedure: The patient was brought to the operating identifies the correct patient.  She had already received bilateral pectoralis nerve blocks by anesthesiology in the preoperative holding area.  She was placed upon the operating room table and general anesthesia was induced.  Under sterile technique I then injected mag trace underneath both nipple areolar complexes and massaged both breast.  Her bilateral breast and axilla were then prepped and draped in usual sterile fashion.  I located the 2 radioactive seeds in the right breast with the neoprobe.  The area of the cancer was in the upper outer quadrant near the axilla in the area of the atypical ductal hyperplasia was in the lower outer quadrant near the nipple areolar complex.  I anesthetized the lateral edge of the nipple areolar complex with Marcaine and made incision with a scalpel.  I then dissected down to the deep breast tissue  trying to stay widely around the signal from the reactive seed.  Once the specimen was removed we marked all margins with paint.  An x-ray was performed for confirming the radioactive seed and one of the original biopsy clip was in the specimen but the second 1 was missing after radiology reviewed later.  I was uncertain of the exact area to take more tissue so I held on any further lumpectomy at the site I then made an incision near the patient's right axilla after anesthetized skin with Marcaine.  I carried this down into the breast tissue again with electrocautery.  With the aid of neoprobe I then stayed widely around the signal from the radioactive seed.  The seed was located against the chest wall and toward the axilla.  I stabilized around this area.  Once I completed the lumpectomy I marked all margins with paint.  The posterior margin is the chest wall and the lateral margin is the axilla.  This was then sent to pathology for evaluation.  With the aid of the mag trace probe I was then able to identify the sentinel lymph nodes through the same incision in the axilla and excise several nodes using the cautery and surgical clips.  These were likewise sent to pathology for evaluation.  No other large lymph nodes were identified.  I placed clips around the periphery of the lumpectomy cavity where the cancer was located in the upper outer quadrant.  I then closed both incisions with interrupted 3-0 Vicryl sutures and running 4-0 Monocryl sutures.  I next turned my attention toward  the left breast.  Again she had 3 seeds in the upper outer quadrant of the breast.  I anesthetized the lateral and superior edge of the areola with Marcaine and made a circumareolar incision with a scalpel.  I then stayed widely around all seeds with the aid of the neoprobe going superiorly and then down to the chest wall and then inferiorly underneath the nipple areolar complex and then laterally toward the axilla.  Once I completed  lumpectomy I marked all margins with paint.  The posterior margin is the chest wall and the most lateral margins of the axilla.  I did take further lateral margin which was sent separately and again this was right on the pectoralis muscle going into the axilla.  The lumpectomy specimen was x-rayed confirming that all radioactive seeds and clips were in the specimen.  1 reactive seed did come out and was x-rayed separately in the specimen cup. Through this incision I was able to reach the axilla and with the aid of the mag trace probe was able to identify the sentinel lymph nodes which I excised with the electrocautery and surgical clips.  Again these lymph nodes were sent to pathology as well.  I achieved hemostasis with the cautery.  I placed surgical clips around the biopsy cavity for marking purposes.  I then closed the subcutaneous tissue with interrupted 3-0 Vicryl sutures and closed the skin with a running 4-0 Monocryl.  The patient tolerated the procedure well.  All the counts were correct at the end of the procedure.  The patient was then extubated in the operating room and taken in a stable condition to the recovery room.          Abigail Miyamoto   Date: 11/10/2022  Time: 2:11 PM

## 2022-11-10 NOTE — Anesthesia Procedure Notes (Signed)
Anesthesia Regional Block: Pectoralis block   Pre-Anesthetic Checklist: , timeout performed,  Correct Patient, Correct Site, Correct Laterality,  Correct Procedure, Correct Position, site marked,  Risks and benefits discussed,  Surgical consent,  Pre-op evaluation,  At surgeon's request and post-op pain management  Laterality: Left  Prep: chloraprep       Needles:  Injection technique: Single-shot  Needle Type: Echogenic Needle     Needle Length: 9cm  Needle Gauge: 21     Additional Needles:   Narrative:  Start time: 11/10/2022 11:33 AM End time: 11/10/2022 11:36 AM Injection made incrementally with aspirations every 5 mL.  Performed by: Personally  Anesthesiologist: Achille Rich, MD  Additional Notes: Pt tolerated the procedure well.

## 2022-11-10 NOTE — Transfer of Care (Signed)
Immediate Anesthesia Transfer of Care Note  Patient: Deborah Walter  Procedure(s) Performed: BILATERAL RADIOACTIVE SEED GUIDED LUMPECTOMY WITH BILATERAL SENTINEL NODE BIOPSY (Bilateral: Breast)  Patient Location: PACU  Anesthesia Type:GA combined with regional for post-op pain  Level of Consciousness: awake, alert , and oriented  Airway & Oxygen Therapy: Patient Spontanous Breathing and Patient connected to face mask oxygen  Post-op Assessment: Report given to RN and Post -op Vital signs reviewed and stable  Post vital signs: Reviewed and stable  Last Vitals:  Vitals Value Taken Time  BP 147/87 11/10/22 1420  Temp    Pulse 96 11/10/22 1422  Resp 20 11/10/22 1422  SpO2 97 % 11/10/22 1422  Vitals shown include unfiled device data.  Last Pain:  Vitals:   11/10/22 1022  TempSrc: Oral  PainSc: 0-No pain         Complications: No notable events documented.

## 2022-11-10 NOTE — Anesthesia Preprocedure Evaluation (Signed)
Anesthesia Evaluation  Patient identified by MRN, date of birth, ID band Patient awake    Reviewed: Allergy & Precautions, H&P , NPO status , Patient's Chart, lab work & pertinent test results  Airway Mallampati: II   Neck ROM: full    Dental   Pulmonary shortness of breath, former smoker   breath sounds clear to auscultation       Cardiovascular hypertension,  Rhythm:regular Rate:Normal     Neuro/Psych    GI/Hepatic ,GERD  ,,  Endo/Other    Renal/GU      Musculoskeletal  (+) Arthritis ,    Abdominal   Peds  Hematology   Anesthesia Other Findings   Reproductive/Obstetrics                             Anesthesia Physical Anesthesia Plan  ASA: 2  Anesthesia Plan: General   Post-op Pain Management: Regional block*   Induction: Intravenous  PONV Risk Score and Plan: 3 and Ondansetron, Dexamethasone, Midazolam and Treatment may vary due to age or medical condition  Airway Management Planned: LMA  Additional Equipment:   Intra-op Plan:   Post-operative Plan: Extubation in OR  Informed Consent: I have reviewed the patients History and Physical, chart, labs and discussed the procedure including the risks, benefits and alternatives for the proposed anesthesia with the patient or authorized representative who has indicated his/her understanding and acceptance.     Dental advisory given  Plan Discussed with: CRNA, Anesthesiologist and Surgeon  Anesthesia Plan Comments:        Anesthesia Quick Evaluation

## 2022-11-11 ENCOUNTER — Encounter (HOSPITAL_BASED_OUTPATIENT_CLINIC_OR_DEPARTMENT_OTHER): Payer: Self-pay | Admitting: Surgery

## 2022-11-11 NOTE — Anesthesia Postprocedure Evaluation (Signed)
Anesthesia Post Note  Patient: Deborah Walter  Procedure(s) Performed: BILATERAL RADIOACTIVE SEED GUIDED LUMPECTOMY WITH BILATERAL SENTINEL NODE BIOPSY (Bilateral: Breast)     Patient location during evaluation: PACU Anesthesia Type: General and Regional Level of consciousness: awake and alert Pain management: pain level controlled Vital Signs Assessment: post-procedure vital signs reviewed and stable Respiratory status: spontaneous breathing, nonlabored ventilation, respiratory function stable and patient connected to nasal cannula oxygen Cardiovascular status: blood pressure returned to baseline and stable Postop Assessment: no apparent nausea or vomiting Anesthetic complications: no   No notable events documented.  Last Vitals:  Vitals:   11/10/22 1500 11/10/22 1527  BP: (!) 150/79 (!) 180/92  Pulse: 84 85  Resp: 14 16  Temp:  37.2 C  SpO2: 94% 95%    Last Pain:  Vitals:   11/10/22 1600  TempSrc:   PainSc: 4                  Ivanell Deshotel S

## 2022-11-18 ENCOUNTER — Encounter: Payer: Self-pay | Admitting: *Deleted

## 2022-11-18 LAB — SURGICAL PATHOLOGY

## 2022-11-19 ENCOUNTER — Telehealth: Payer: Self-pay | Admitting: *Deleted

## 2022-11-19 ENCOUNTER — Encounter: Payer: Self-pay | Admitting: *Deleted

## 2022-11-19 NOTE — Telephone Encounter (Signed)
Ordered oncotype per Dr. Mosetta Putt on Left breast . Sent requisition to pathology and exact sciences.

## 2022-11-23 ENCOUNTER — Other Ambulatory Visit: Payer: Self-pay

## 2022-11-30 ENCOUNTER — Encounter: Payer: Self-pay | Admitting: Hematology

## 2022-12-01 ENCOUNTER — Encounter (HOSPITAL_COMMUNITY): Payer: Self-pay

## 2022-12-06 ENCOUNTER — Encounter: Payer: Self-pay | Admitting: *Deleted

## 2022-12-06 ENCOUNTER — Telehealth: Payer: Self-pay | Admitting: *Deleted

## 2022-12-06 ENCOUNTER — Telehealth: Payer: Self-pay | Admitting: Radiation Oncology

## 2022-12-06 DIAGNOSIS — C50411 Malignant neoplasm of upper-outer quadrant of right female breast: Secondary | ICD-10-CM

## 2022-12-06 NOTE — Telephone Encounter (Signed)
Received oncotype score of 0 Physician team notified.  Called pt with results, left vm informing next step is xrt with Dr. Mitzi Hansen Referral placed Contact information provided for questions or needs

## 2022-12-06 NOTE — Telephone Encounter (Signed)
11/11 @ 2:36 pm Left voicemail for patient to call our office to be schedule for follow up consult with ct sim/treatment planning.

## 2022-12-07 ENCOUNTER — Encounter: Payer: Self-pay | Admitting: *Deleted

## 2022-12-13 NOTE — Progress Notes (Signed)
Radiation Oncology         [id number]) [phone] ________________________________  Name: Deborah Walter        MRN: [id number]  Date of Service: 12/16/2022 DOB: March 08, 1960  NF:AOZH, Elie Confer, DO  Malachy Mood, MD     REFERRING PHYSICIAN: Malachy Mood, MD   DIAGNOSIS: The primary encounter diagnosis was 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, left (HCC). A diagnosis of Primary malignant neoplasm of upper outer quadrant of right breast [facility]) was also pertinent to this visit.   HISTORY OF PRESENT ILLNESS: Deborah Walter is a 62 y.o. female originally seen in the multidisciplinary breast clinic for a new diagnosis of bilateral breast cancer. The patient was noted to have screening detected masses by mammogram on 07/28/22 at [facility]. Further diagnostic imaging showed a mass in the 12:30 position of the left breast measuring 8 mm, and adjacent mass at 1:00 measuring 6 mm. There was also a 1.5 cm intraductal mass int he right breast at 8:00 position, as well as a mass in the 10:00 right breast measuring 8 mm. There was also unchanged posterior central right breast asymmetry. No adenopathy was noted in either axilla. Multiple biopsies were taken on 08/11/22. The left breast at 12:30 showed grade 2 invasive lobular carcinoma, and the 1:00 left breast biopsy showed similar grade 2 invasive mammary carcinoma that was ER/PR positive, HER2 negative with Ki-67 of 5% of the 12:30 biopsy, and ER/PR positive HER2 negative with Ki 67 of 15% on the 1:00 biopsy. The right core biopsy at 8:00 showed intraductal papilloma, and the 10:00 specimen showed grade 1, invasive ductal carcinoma. Prognostics were not run.  She had an MRI breast on 09/18/22 that showed her biopsy proven areas of cancer and papilloma, but also two areas of the right breast in the right medial and lower inner right breast, both indeterminate, and a small enhancing mass in the upper central posterior left breast. 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 On 10/11/22, stereotactic biopsy of the right LOQ calcs showed atypical ductal hyperplasia bordering on DCIS without invasive disease. ER staining was observed. She underwent lumpectomies on 11/10/22 and the right lower outer specimen showed focal atypical lobular hyperplasia, and right 10:00 position lumpectomy showed a grade 1 invasive ductal carcinoma measuring 1 cm, and associated intermediate grade DCIS with calcifications were noted. The margins were negative for invasive disease measuring 2 mm from the posterior aspect. The 6 right axillary nodes are negative for disease. The left breast lumpectomy showed two foci of grade 2 invasive lobular carcinoma measuring 3.2 cm, and 1.2 cm there was also multifocal lobular carcinoma in situ and focal atypia ductal hyperplasia the resection margins were negative with the closest invasive 1 mm at the anterior margin additional lateral margin excision was benign and 1 lymph node in the left axilla was negative.  Oncotype DX score was performed on the left breast specimen and was 0.  She is seen today to discuss adjuvant radiation.   PREVIOUS RADIATION THERAPY: No   PAST MEDICAL HISTORY:  Past Medical History:  Diagnosis Date   Acid reflux    Arthritis    osteoarthritis   Elevated cholesterol    Family history of adverse reaction to anesthesia    mother- hard to wake her up   H/O seasonal allergies    Hypertension    Urticaria        PAST SURGICAL HISTORY: Past  Surgical History:  Procedure Laterality Date   BREAST BIOPSY Right 10/11/2022   MM RT BREAST BX W LOC DEV 1ST LESION IMAGE BX SPEC STEREO GUIDE 10/11/2022 [facility]MO[facility]   BREAST BIOPSY  11/09/2022   [place] RT RADIOACTIVE SEED LOC 11/09/2022 [facility] ULTRASOUND   BREAST BIOPSY Left 11/09/2022   [place] LT RADIOACTIVE SEED LOC 11/09/2022 [facility] ULTRASOUND   BREAST BIOPSY Left 11/09/2022   [place] LT RADIOACTIVE SEED LOC 11/09/2022  [facility]TRAS[facility]   BREAST BIOPSY  11/09/2022   MM LT RADIOACTIVE SEED LOC MAMMO GUIDE 11/09/2022 [facility]AMMOGRAPHY   BREAST BIOPSY  11/09/2022   MM RT RADIOACTIVE SEED LOC MAMMO GUIDE 11/09/2022 [facility] MAMMO[facility]   BREAST LUMPECTOMY WITH RADIOACTIVE SEED AND SENTINEL LYMPH NODE BIOPSY Bilateral 11/10/2022   Procedure: BILATERAL RADIOACTIVE SEED GUIDED LUMPECTOMY WITH BILATERAL SENTINEL NODE BIOPSY;  Surgeon: Abigail Miyamoto, MD;  Location: [facility];  Service: General;  Laterality: Bilateral;  LMA PEC BLOCK   CLAVICLE SURGERY Right Early 1990   COLONOSCOPY WITH PROPOFOL N/A 02/27/2018   Procedure: COLONOSCOPY WITH PROPOFOL;  Surgeon: Corbin Ade, MD;  [facility];  Service: Endoscopy;  Laterality: N/A;  8:30am   CYSTECTOMY  Early 1990   Bottom of spine   NASAL SEPTOPLASTY W/ TURBINOPLASTY Bilateral 09/01/2015   Procedure: NASAL SEPTOPLASTY WITH TURBINATE REDUCTION;  Surgeon: Newman Pies, MD;  Location: [facility];  Service: ENT;  Laterality: Bilateral;   POLYPECTOMY  02/27/2018   Procedure: POLYPECTOMY;  Surgeon: Corbin Ade, MD;  Location: [facility];  Service: Endoscopy;;  hepatic flexure, rectum   SINOSCOPY     SINUS ENDO WITH FUSION Left 09/01/2015   Procedure: LEFT ENDOSCOPIC TOTAL ETHMOIDECTOMY LEFT ENDOSCOPIC  MAXILLARY ANTROSTOMY LEFT ENDOSCOPIC FRONTAL RECESS EXPLORATION;  Surgeon: Newman Pies, MD;  Location:  SURGERY CENTER;  Service: ENT;  Laterality: Left;   TOTAL SHOULDER ARTHROPLASTY Right 11/09/2018   Procedure: TOTAL SHOULDER ARTHROPLASTY;  Surgeon: Jones Broom, MD;  Location: [facility];  Service: Orthopedics;  Laterality: Right;   TUBAL LIGATION  1982     FAMILY HISTORY:  Family History  Problem Relation Age of Onset   Asthma Father    Asthma Sister    Cancer Paternal Aunt        LUNG CANCER   Eczema Neg Hx    Immunodeficiency Neg Hx    Urticaria Neg Hx    Angioedema Neg Hx    Atopy Neg Hx    Colon cancer  Neg Hx    Colon polyps Neg Hx      SOCIAL HISTORY:  reports that she quit smoking about 8 years ago. Her smoking use included cigarettes. She started smoking about 41 years ago. She has a 66 pack-year smoking history. She has never been exposed to tobacco smoke. She has never used smokeless tobacco. She reports current alcohol use. She reports that she does not use drugs. The patient is single, lives in [place], and works in a Estate agent.    ALLERGIES: Penicillins, Atorvastatin, Diclofenac, Dust mite extract, Gramineae pollens, Other, Pravastatin, Amoxicillin, Cefuroxime, and Tizanidine hcl   MEDICATIONS:  Current Outpatient Medications  Medication Sig Dispense Refill   Ascorbic Acid (VITAMIN C) 125 MG CHEW Chew by mouth.     Biotin 10 MG CAPS Take 10,000 capsules by mouth daily.     Cholecalciferol (VITAMIN D3) 250 MCG (10000 UT) capsule Take 10,000 Units by mouth daily.     ciclopirox (PENLAC) 8 % solution Apply  1 Application topically at bedtime.     ezetimibe (ZETIA) 10 MG tablet Take 10 mg by mouth daily.     fluticasone (FLONASE) 50 MCG/ACT nasal spray Place 2 sprays into both nostrils daily. 16 g 5   levocetirizine (XYZAL) 5 MG tablet Take 1 tablet (5 mg total) by mouth every evening. 30 tablet 5   lisinopril-hydrochlorothiazide (PRINZIDE,ZESTORETIC) 20-25 MG tablet Take 1 tablet by mouth daily.     Omega-3 Fatty Acids (FISH OIL) 1000 MG CAPS Take by mouth.     omeprazole (PRILOSEC) 20 MG capsule Take 20 mg by mouth daily.   0   oxyCODONE-acetaminophen (PERCOCET) 5-325 MG tablet Take 1-2 tablets every 4 hours as needed for post operative pain. MAX 6/day 30 tablet 0   PROAIR HFA 108 (90 Base) MCG/ACT inhaler INHALE 2 PUFFS BY MOUTH EVERY 6 HOURS AS NEEDED FOR WHEEZING FOR SHORTNESS OF BREATH 18 g 2   vitamin B-12 (CYANOCOBALAMIN) 100 MCG tablet Take 100 mcg by mouth daily.     No current facility-administered medications for this visit.     REVIEW OF SYSTEMS: On  review of systems, the patient reports that she is doing well overall but is having problems with discomfort in her axilla that she believes is related to her sentinel node biopsies.  She describes burning and numbness and feeling like her skin is on fire in the underarms.  She has spoken with Dr. Magnus Ivan about this and has been trying to take Tylenol as she has not previously found medicines like gabapentin to be helpful for other conditions.  No other complaints are verbalized, though she has been out of short-term disability due to her axillary symptoms and requests continuation of this until her symptoms resolve.     PHYSICAL EXAM:  Wt Readings from Last 3 Encounters:  11/10/22 235 lb 0.2 oz (106.6 kg)  11/05/22 235 lb (106.6 kg)  09/21/22 229 lb 12.8 oz (104.2 kg)   Temp Readings from Last 3 Encounters:  11/10/22 98.9 F (37.2 C) (Temporal)  11/05/22 (!) 101.2 F (38.4 C) (Temporal)  09/21/22 98 F (36.7 C) (Oral)   BP Readings from Last 3 Encounters:  11/10/22 (!) 180/92  11/05/22 (!) 140/78  09/21/22 (!) 147/64   Pulse Readings from Last 3 Encounters:  11/10/22 85  11/05/22 76  09/21/22 74    In general this is a well appearing caucasian female in no acute distress. She's alert and oriented x4 and appropriate throughout the examination. Cardiopulmonary assessment is negative for acute distress and she exhibits normal effort. Bilateral breasts have well-healed surgical incision sites and axillary incisions as well without erythema, separation or drainage.    ECOG = 1  0 - Asymptomatic (Fully active, able to carry on all predisease activities without restriction)  1 - Symptomatic but completely ambulatory (Restricted in physically strenuous activity but ambulatory and able to carry out work of a light or sedentary nature. For example, light housework, office work)  2 - Symptomatic, <50% in bed during the day (Ambulatory and capable of all self care but unable to carry out  any work activities. Up and about more than 50% of waking hours)  3 - Symptomatic, >50% in bed, but not bedbound (Capable of only limited self-care, confined to bed or chair 50% or more of waking hours)  4 - Bedbound (Completely disabled. Cannot carry on any self-care. Totally confined to bed or chair)  5 - Death   Arnoldo Hooker, Creech RH, Tormey DC, et  al. [phone]). "Toxicity and response criteria of the [facility]". Am. Evlyn Clines. Oncol. 5 (6): 649-55    LABORATORY DATA:  Lab Results  Component Value Date   WBC 7.4 09/21/2022   HGB 13.5 09/21/2022   HCT 39.5 09/21/2022   MCV 93.8 09/21/2022   PLT 285 09/21/2022   Lab Results  Component Value Date   NA 138 09/21/2022   K 4.4 09/21/2022   CL 101 09/21/2022   CO2 29 09/21/2022   Lab Results  Component Value Date   ALT 26 09/21/2022   AST 26 09/21/2022   ALKPHOS 80 09/21/2022   BILITOT 0.6 09/21/2022      RADIOGRAPHY: No results found.     IMPRESSION/PLAN: 1. Synchronous Stage IA, pT2N0M0, grade 2, ER/PR positive invasive lobular carcinoma of the left breast, and Stage IA, pT1bN0M0, grade 1, ER/PR positive invasive ductal carcinoma of the right breast. Dr. Mitzi Hansen has reviewed the final  pathology findings she has done well since surgery.  Based on her Oncotype DX of 0 on the left breast there is no plan for systemic chemotherapy per Dr. Mosetta Putt.  Dr. Mitzi Hansen recommends adjuvant external radiotherapy to the breasts to reduce risks of local recurrence.  Her course would then be followed by antiestrogen therapy with Dr. Mosetta Putt.  We discussed the risks, benefits, short, and long term effects of radiotherapy, as well as the curative intent. We reviewed the delivery and logistics of radiotherapy and Dr. Mitzi Hansen recommends 4 weeks of radiotherapy to bilateral breasts. Written consent is obtained and placed in the chart, a copy was provided to the patient. She will simulate today.  A letter was also provided so she may remove  remain out of work during radiation as well due to logistics of her treatment. 2. Bilateral axillary/breast pain.  We discussed this is likely neurologic in nature and I encouraged her to follow-up with Dr. Magnus Ivan as well as evaluation with second to major for compression style bra which may be beneficial and managing her symptoms.  She is in agreement with this plan.    In a visit lasting 45 minutes, greater than 50% of the time was spent face to face reviewing her case, as well as in preparation of, discussing, and coordinating the patient's care.     Osker Mason, [facility]    **Disclaimer: This note was dictated with voice recognition software. Similar sounding words can inadvertently be transcribed and this note may contain transcription errors which may not have been corrected upon publication of note.**

## 2022-12-16 ENCOUNTER — Other Ambulatory Visit: Payer: Self-pay

## 2022-12-16 ENCOUNTER — Encounter: Payer: Self-pay | Admitting: Radiation Oncology

## 2022-12-16 ENCOUNTER — Ambulatory Visit
Admission: RE | Admit: 2022-12-16 | Payer: BC Managed Care – PPO | Source: Ambulatory Visit | Admitting: Radiation Oncology

## 2022-12-16 ENCOUNTER — Ambulatory Visit
Admission: RE | Admit: 2022-12-16 | Discharge: 2022-12-16 | Disposition: A | Discharge status: Home / Self Care | Payer: BC Managed Care – PPO | Source: Ambulatory Visit | Attending: Radiation Oncology | Admitting: Radiation Oncology

## 2022-12-16 VITALS — BP 143/80 | HR 109 | Temp 97.7°F | Resp 16 | Ht 69.0 in | Wt 245.2 lb

## 2022-12-16 DIAGNOSIS — I10 Essential (primary) hypertension: Secondary | ICD-10-CM | POA: Insufficient documentation

## 2022-12-16 DIAGNOSIS — Z801 Family history of malignant neoplasm of trachea, bronchus and lung: Secondary | ICD-10-CM | POA: Diagnosis not present

## 2022-12-16 DIAGNOSIS — Z79899 Other long term (current) drug therapy: Secondary | ICD-10-CM | POA: Insufficient documentation

## 2022-12-16 DIAGNOSIS — C50412 Malignant neoplasm of upper-outer quadrant of left female breast: Secondary | ICD-10-CM | POA: Insufficient documentation

## 2022-12-16 DIAGNOSIS — K219 Gastro-esophageal reflux disease without esophagitis: Secondary | ICD-10-CM | POA: Insufficient documentation

## 2022-12-16 DIAGNOSIS — C50411 Malignant neoplasm of upper-outer quadrant of right female breast: Secondary | ICD-10-CM | POA: Diagnosis not present

## 2022-12-16 DIAGNOSIS — Z510 Encounter for antineoplastic radiation therapy: Secondary | ICD-10-CM | POA: Diagnosis not present

## 2022-12-16 DIAGNOSIS — E7800 Pure hypercholesterolemia, unspecified: Secondary | ICD-10-CM | POA: Insufficient documentation

## 2022-12-16 DIAGNOSIS — M1990 Unspecified osteoarthritis, unspecified site: Secondary | ICD-10-CM | POA: Diagnosis not present

## 2022-12-16 DIAGNOSIS — Z87891 Personal history of nicotine dependence: Secondary | ICD-10-CM | POA: Insufficient documentation

## 2022-12-16 NOTE — Progress Notes (Signed)
Nursing interview for 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, left (HCC). A diagnosis of Primary malignant neoplasm of upper outer quadrant of right breast [facility]) was also pertinent to this visit.   Patient identity verified x2.  Patient reports bilateral beast/ axilla pain 8/10 due to lumpectomy. Patient denies chest pain/arm, and SOB at this time.  Meaningful use complete.  Vitals- BP (!) 143/80 (BP Location: Left Arm, Patient Position: Sitting, Cuff Size: Large)   Pulse (!) 109   Temp 97.7 F (36.5 C) (Temporal)   Resp 16   Ht 5\' 9"  (1.753 m)   Wt 245 lb 3.2 oz (111.2 kg)   SpO2 98%   BMI 36.21 kg/m   This concludes the interaction.  Ruel Favors, LPN

## 2022-12-20 ENCOUNTER — Telehealth: Payer: Self-pay | Admitting: *Deleted

## 2022-12-20 NOTE — Telephone Encounter (Signed)
Received form signed by provider.  FMLA successfully returned via fax.   Mailed form to Christiana Pellant address on file. [address] [place] [address] Copy to [employer] bin for items to be scanned completes process.  No further instructions received or actions performed by this nurse.

## 2022-12-27 ENCOUNTER — Encounter: Payer: Self-pay | Admitting: *Deleted

## 2022-12-27 DIAGNOSIS — C50411 Malignant neoplasm of upper-outer quadrant of right female breast: Secondary | ICD-10-CM

## 2022-12-29 ENCOUNTER — Ambulatory Visit
Admission: RE | Admit: 2022-12-29 | Discharge: 2022-12-29 | Disposition: A | Discharge status: Home / Self Care | Payer: BC Managed Care – PPO | Source: Ambulatory Visit | Attending: Hematology | Admitting: Hematology

## 2022-12-29 DIAGNOSIS — C50412 Malignant neoplasm of upper-outer quadrant of left female breast: Secondary | ICD-10-CM | POA: Diagnosis present

## 2022-12-29 DIAGNOSIS — Z510 Encounter for antineoplastic radiation therapy: Secondary | ICD-10-CM | POA: Insufficient documentation

## 2022-12-29 DIAGNOSIS — C50411 Malignant neoplasm of upper-outer quadrant of right female breast: Secondary | ICD-10-CM | POA: Diagnosis present

## 2022-12-30 ENCOUNTER — Other Ambulatory Visit: Payer: Self-pay

## 2022-12-30 DIAGNOSIS — Z510 Encounter for antineoplastic radiation therapy: Secondary | ICD-10-CM | POA: Diagnosis not present

## 2022-12-30 LAB — RAD ONC ARIA SESSION SUMMARY
Course Elapsed Days: 0
Plan Fractions Treated to Date: 1
Plan Fractions Treated to Date: 1
Plan Prescribed Dose Per Fraction: 2.66 Gy
Plan Prescribed Dose Per Fraction: 2.66 Gy
Plan Total Fractions Prescribed: 16
Plan Total Fractions Prescribed: 16
Plan Total Prescribed Dose: 42.56 Gy
Plan Total Prescribed Dose: 42.56 Gy
Reference Point Dosage Given to Date: 2.66 Gy
Reference Point Dosage Given to Date: 2.66 Gy
Reference Point Session Dosage Given: 2.66 Gy
Reference Point Session Dosage Given: 2.66 Gy
Session Number: 1

## 2022-12-31 ENCOUNTER — Ambulatory Visit
Admission: RE | Admit: 2022-12-31 | Discharge: 2022-12-31 | Disposition: A | Discharge status: Home / Self Care | Payer: BC Managed Care – PPO | Source: Ambulatory Visit | Attending: Radiation Oncology | Admitting: Radiation Oncology

## 2022-12-31 ENCOUNTER — Other Ambulatory Visit: Payer: Self-pay

## 2022-12-31 ENCOUNTER — Ambulatory Visit
Admission: RE | Admit: 2022-12-31 | Discharge: 2022-12-31 | Disposition: A | Discharge status: Home / Self Care | Payer: BC Managed Care – PPO | Source: Ambulatory Visit | Attending: Radiation Oncology

## 2022-12-31 DIAGNOSIS — Z510 Encounter for antineoplastic radiation therapy: Secondary | ICD-10-CM | POA: Diagnosis not present

## 2022-12-31 DIAGNOSIS — C50412 Malignant neoplasm of upper-outer quadrant of left female breast: Secondary | ICD-10-CM

## 2022-12-31 LAB — RAD ONC ARIA SESSION SUMMARY
Course Elapsed Days: 1
Plan Fractions Treated to Date: 2
Plan Fractions Treated to Date: 2
Plan Prescribed Dose Per Fraction: 2.66 Gy
Plan Prescribed Dose Per Fraction: 2.66 Gy
Plan Total Fractions Prescribed: 16
Plan Total Fractions Prescribed: 16
Plan Total Prescribed Dose: 42.56 Gy
Plan Total Prescribed Dose: 42.56 Gy
Reference Point Dosage Given to Date: 5.32 Gy
Reference Point Dosage Given to Date: 5.32 Gy
Reference Point Session Dosage Given: 2.66 Gy
Reference Point Session Dosage Given: 2.66 Gy
Session Number: 2

## 2022-12-31 MED ORDER — ALRA NON-METALLIC DEODORANT (RAD-ONC)
1.0000 | Freq: Once | TOPICAL | Status: AC
  Administered 2022-12-31: 1 via TOPICAL

## 2022-12-31 MED ORDER — RADIAPLEXRX EX GEL
Freq: Once | CUTANEOUS | Status: AC
Start: 2022-12-31 — End: 2022-12-31

## 2023-01-03 ENCOUNTER — Ambulatory Visit
Admission: RE | Admit: 2023-01-03 | Discharge: 2023-01-03 | Disposition: A | Discharge status: Home / Self Care | Payer: BC Managed Care – PPO | Source: Ambulatory Visit | Attending: Radiation Oncology | Admitting: Radiation Oncology

## 2023-01-03 ENCOUNTER — Other Ambulatory Visit: Payer: Self-pay

## 2023-01-03 DIAGNOSIS — Z510 Encounter for antineoplastic radiation therapy: Secondary | ICD-10-CM | POA: Diagnosis not present

## 2023-01-03 LAB — RAD ONC ARIA SESSION SUMMARY
Course Elapsed Days: 4
Plan Fractions Treated to Date: 3
Plan Fractions Treated to Date: 3
Plan Prescribed Dose Per Fraction: 2.66 Gy
Plan Prescribed Dose Per Fraction: 2.66 Gy
Plan Total Fractions Prescribed: 16
Plan Total Fractions Prescribed: 16
Plan Total Prescribed Dose: 42.56 Gy
Plan Total Prescribed Dose: 42.56 Gy
Reference Point Dosage Given to Date: 7.98 Gy
Reference Point Dosage Given to Date: 7.98 Gy
Reference Point Session Dosage Given: 2.66 Gy
Reference Point Session Dosage Given: 2.66 Gy
Session Number: 3

## 2023-01-04 ENCOUNTER — Ambulatory Visit
Admission: RE | Admit: 2023-01-04 | Discharge: 2023-01-04 | Disposition: A | Discharge status: Home / Self Care | Payer: BC Managed Care – PPO | Source: Ambulatory Visit | Attending: Radiation Oncology

## 2023-01-04 ENCOUNTER — Other Ambulatory Visit: Payer: Self-pay

## 2023-01-04 DIAGNOSIS — Z510 Encounter for antineoplastic radiation therapy: Secondary | ICD-10-CM | POA: Diagnosis not present

## 2023-01-04 LAB — RAD ONC ARIA SESSION SUMMARY
Course Elapsed Days: 5
Plan Fractions Treated to Date: 4
Plan Fractions Treated to Date: 4
Plan Prescribed Dose Per Fraction: 2.66 Gy
Plan Prescribed Dose Per Fraction: 2.66 Gy
Plan Total Fractions Prescribed: 16
Plan Total Fractions Prescribed: 16
Plan Total Prescribed Dose: 42.56 Gy
Plan Total Prescribed Dose: 42.56 Gy
Reference Point Dosage Given to Date: 10.64 Gy
Reference Point Dosage Given to Date: 10.64 Gy
Reference Point Session Dosage Given: 2.66 Gy
Reference Point Session Dosage Given: 2.66 Gy
Session Number: 4

## 2023-01-05 ENCOUNTER — Ambulatory Visit
Admission: RE | Admit: 2023-01-05 | Discharge: 2023-01-05 | Disposition: A | Discharge status: Home / Self Care | Payer: BC Managed Care – PPO | Source: Ambulatory Visit | Attending: Radiation Oncology | Admitting: Radiation Oncology

## 2023-01-05 ENCOUNTER — Other Ambulatory Visit: Payer: Self-pay

## 2023-01-05 DIAGNOSIS — Z510 Encounter for antineoplastic radiation therapy: Secondary | ICD-10-CM | POA: Diagnosis not present

## 2023-01-05 LAB — RAD ONC ARIA SESSION SUMMARY
Course Elapsed Days: 6
Plan Fractions Treated to Date: 5
Plan Fractions Treated to Date: 5
Plan Prescribed Dose Per Fraction: 2.66 Gy
Plan Prescribed Dose Per Fraction: 2.66 Gy
Plan Total Fractions Prescribed: 16
Plan Total Fractions Prescribed: 16
Plan Total Prescribed Dose: 42.56 Gy
Plan Total Prescribed Dose: 42.56 Gy
Reference Point Dosage Given to Date: 13.3 Gy
Reference Point Dosage Given to Date: 13.3 Gy
Reference Point Session Dosage Given: 2.66 Gy
Reference Point Session Dosage Given: 2.66 Gy
Session Number: 5

## 2023-01-06 ENCOUNTER — Ambulatory Visit
Admission: RE | Admit: 2023-01-06 | Discharge: 2023-01-06 | Disposition: A | Discharge status: Home / Self Care | Payer: BC Managed Care – PPO | Source: Ambulatory Visit | Attending: Radiation Oncology | Admitting: Radiation Oncology

## 2023-01-06 ENCOUNTER — Other Ambulatory Visit: Payer: Self-pay

## 2023-01-06 DIAGNOSIS — Z510 Encounter for antineoplastic radiation therapy: Secondary | ICD-10-CM | POA: Diagnosis not present

## 2023-01-06 LAB — RAD ONC ARIA SESSION SUMMARY
Course Elapsed Days: 7
Plan Fractions Treated to Date: 6
Plan Fractions Treated to Date: 6
Plan Prescribed Dose Per Fraction: 2.66 Gy
Plan Prescribed Dose Per Fraction: 2.66 Gy
Plan Total Fractions Prescribed: 16
Plan Total Fractions Prescribed: 16
Plan Total Prescribed Dose: 42.56 Gy
Plan Total Prescribed Dose: 42.56 Gy
Reference Point Dosage Given to Date: 15.96 Gy
Reference Point Dosage Given to Date: 15.96 Gy
Reference Point Session Dosage Given: 2.66 Gy
Reference Point Session Dosage Given: 2.66 Gy
Session Number: 6

## 2023-01-07 ENCOUNTER — Telehealth: Payer: Self-pay | Admitting: Radiation Oncology

## 2023-01-07 ENCOUNTER — Ambulatory Visit
Admission: RE | Admit: 2023-01-07 | Discharge: 2023-01-07 | Disposition: A | Discharge status: Home / Self Care | Payer: BC Managed Care – PPO | Source: Ambulatory Visit | Attending: Radiation Oncology

## 2023-01-07 ENCOUNTER — Telehealth: Payer: Self-pay | Admitting: *Deleted

## 2023-01-07 ENCOUNTER — Ambulatory Visit
Admission: RE | Admit: 2023-01-07 | Discharge: 2023-01-07 | Disposition: A | Discharge status: Home / Self Care | Payer: BC Managed Care – PPO | Source: Ambulatory Visit | Attending: Radiation Oncology | Admitting: Radiation Oncology

## 2023-01-07 ENCOUNTER — Other Ambulatory Visit: Payer: Self-pay

## 2023-01-07 ENCOUNTER — Telehealth: Payer: Self-pay | Admitting: Hematology

## 2023-01-07 DIAGNOSIS — C50411 Malignant neoplasm of upper-outer quadrant of right female breast: Secondary | ICD-10-CM

## 2023-01-07 DIAGNOSIS — C50412 Malignant neoplasm of upper-outer quadrant of left female breast: Secondary | ICD-10-CM

## 2023-01-07 DIAGNOSIS — Z510 Encounter for antineoplastic radiation therapy: Secondary | ICD-10-CM | POA: Diagnosis not present

## 2023-01-07 LAB — RAD ONC ARIA SESSION SUMMARY
Course Elapsed Days: 8
Plan Fractions Treated to Date: 7
Plan Fractions Treated to Date: 7
Plan Prescribed Dose Per Fraction: 2.66 Gy
Plan Prescribed Dose Per Fraction: 2.66 Gy
Plan Total Fractions Prescribed: 16
Plan Total Fractions Prescribed: 16
Plan Total Prescribed Dose: 42.56 Gy
Plan Total Prescribed Dose: 42.56 Gy
Reference Point Dosage Given to Date: 18.62 Gy
Reference Point Dosage Given to Date: 18.62 Gy
Reference Point Session Dosage Given: 2.66 Gy
Reference Point Session Dosage Given: 2.66 Gy
Session Number: 7

## 2023-01-07 MED ORDER — RADIAPLEXRX EX GEL
Freq: Once | CUTANEOUS | Status: AC
Start: 2023-01-07 — End: 2023-01-07

## 2023-01-07 NOTE — Telephone Encounter (Signed)
Spoke with patient regarding a message that she had questions regarding an MRI from August.  She states she just wanted to know if she was seeing Dr. Mosetta Putt again. Informed her of the appt 1/7 at 2:20pm.  Patient denied any other questions.

## 2023-01-07 NOTE — Telephone Encounter (Signed)
12/13 patient drop off short-term disability forms to be filled out and returned.  Forms placed in orange folder and then placed in FMLA basket for pickup.

## 2023-01-10 ENCOUNTER — Other Ambulatory Visit: Payer: Self-pay

## 2023-01-10 ENCOUNTER — Ambulatory Visit
Admission: RE | Admit: 2023-01-10 | Discharge: 2023-01-10 | Disposition: A | Discharge status: Home / Self Care | Payer: BC Managed Care – PPO | Source: Ambulatory Visit | Attending: Radiation Oncology

## 2023-01-10 DIAGNOSIS — Z510 Encounter for antineoplastic radiation therapy: Secondary | ICD-10-CM | POA: Diagnosis not present

## 2023-01-10 LAB — RAD ONC ARIA SESSION SUMMARY
Course Elapsed Days: 11
Plan Fractions Treated to Date: 8
Plan Fractions Treated to Date: 8
Plan Prescribed Dose Per Fraction: 2.66 Gy
Plan Prescribed Dose Per Fraction: 2.66 Gy
Plan Total Fractions Prescribed: 16
Plan Total Fractions Prescribed: 16
Plan Total Prescribed Dose: 42.56 Gy
Plan Total Prescribed Dose: 42.56 Gy
Reference Point Dosage Given to Date: 21.28 Gy
Reference Point Dosage Given to Date: 21.28 Gy
Reference Point Session Dosage Given: 2.66 Gy
Reference Point Session Dosage Given: 2.66 Gy
Session Number: 8

## 2023-01-11 ENCOUNTER — Other Ambulatory Visit: Payer: Self-pay

## 2023-01-11 ENCOUNTER — Ambulatory Visit
Admission: RE | Admit: 2023-01-11 | Discharge: 2023-01-11 | Disposition: A | Discharge status: Home / Self Care | Payer: BC Managed Care – PPO | Source: Ambulatory Visit | Attending: Radiation Oncology | Admitting: Radiation Oncology

## 2023-01-11 DIAGNOSIS — Z510 Encounter for antineoplastic radiation therapy: Secondary | ICD-10-CM | POA: Diagnosis not present

## 2023-01-11 LAB — RAD ONC ARIA SESSION SUMMARY
Course Elapsed Days: 12
Plan Fractions Treated to Date: 9
Plan Fractions Treated to Date: 9
Plan Prescribed Dose Per Fraction: 2.66 Gy
Plan Prescribed Dose Per Fraction: 2.66 Gy
Plan Total Fractions Prescribed: 16
Plan Total Fractions Prescribed: 16
Plan Total Prescribed Dose: 42.56 Gy
Plan Total Prescribed Dose: 42.56 Gy
Reference Point Dosage Given to Date: 23.94 Gy
Reference Point Dosage Given to Date: 23.94 Gy
Reference Point Session Dosage Given: 2.66 Gy
Reference Point Session Dosage Given: 2.66 Gy
Session Number: 9

## 2023-01-12 ENCOUNTER — Ambulatory Visit: Payer: BC Managed Care – PPO

## 2023-01-13 ENCOUNTER — Other Ambulatory Visit: Payer: Self-pay

## 2023-01-13 ENCOUNTER — Ambulatory Visit
Admission: RE | Admit: 2023-01-13 | Discharge: 2023-01-13 | Disposition: A | Discharge status: Home / Self Care | Payer: BC Managed Care – PPO | Source: Ambulatory Visit | Attending: Radiation Oncology | Admitting: Radiation Oncology

## 2023-01-13 DIAGNOSIS — Z510 Encounter for antineoplastic radiation therapy: Secondary | ICD-10-CM | POA: Diagnosis not present

## 2023-01-13 LAB — RAD ONC ARIA SESSION SUMMARY
Course Elapsed Days: 14
Plan Fractions Treated to Date: 10
Plan Fractions Treated to Date: 10
Plan Prescribed Dose Per Fraction: 2.66 Gy
Plan Prescribed Dose Per Fraction: 2.66 Gy
Plan Total Fractions Prescribed: 16
Plan Total Fractions Prescribed: 16
Plan Total Prescribed Dose: 42.56 Gy
Plan Total Prescribed Dose: 42.56 Gy
Reference Point Dosage Given to Date: 26.6 Gy
Reference Point Dosage Given to Date: 26.6 Gy
Reference Point Session Dosage Given: 2.66 Gy
Reference Point Session Dosage Given: 2.66 Gy
Session Number: 10

## 2023-01-14 ENCOUNTER — Ambulatory Visit
Admission: RE | Admit: 2023-01-14 | Discharge: 2023-01-14 | Disposition: A | Discharge status: Home / Self Care | Payer: BC Managed Care – PPO | Source: Ambulatory Visit | Attending: Radiation Oncology | Admitting: Radiation Oncology

## 2023-01-14 ENCOUNTER — Other Ambulatory Visit: Payer: Self-pay

## 2023-01-14 DIAGNOSIS — Z510 Encounter for antineoplastic radiation therapy: Secondary | ICD-10-CM | POA: Diagnosis not present

## 2023-01-14 LAB — RAD ONC ARIA SESSION SUMMARY
Course Elapsed Days: 15
Plan Fractions Treated to Date: 11
Plan Fractions Treated to Date: 11
Plan Prescribed Dose Per Fraction: 2.66 Gy
Plan Prescribed Dose Per Fraction: 2.66 Gy
Plan Total Fractions Prescribed: 16
Plan Total Fractions Prescribed: 16
Plan Total Prescribed Dose: 42.56 Gy
Plan Total Prescribed Dose: 42.56 Gy
Reference Point Dosage Given to Date: 29.26 Gy
Reference Point Dosage Given to Date: 29.26 Gy
Reference Point Session Dosage Given: 2.66 Gy
Reference Point Session Dosage Given: 2.66 Gy
Session Number: 11

## 2023-01-17 ENCOUNTER — Other Ambulatory Visit: Payer: Self-pay

## 2023-01-17 ENCOUNTER — Ambulatory Visit
Admission: RE | Admit: 2023-01-17 | Discharge: 2023-01-17 | Disposition: A | Discharge status: Home / Self Care | Payer: BC Managed Care – PPO | Source: Ambulatory Visit | Attending: Radiation Oncology | Admitting: Radiation Oncology

## 2023-01-17 DIAGNOSIS — Z510 Encounter for antineoplastic radiation therapy: Secondary | ICD-10-CM | POA: Diagnosis not present

## 2023-01-17 LAB — RAD ONC ARIA SESSION SUMMARY
Course Elapsed Days: 18
Plan Fractions Treated to Date: 12
Plan Fractions Treated to Date: 12
Plan Prescribed Dose Per Fraction: 2.66 Gy
Plan Prescribed Dose Per Fraction: 2.66 Gy
Plan Total Fractions Prescribed: 16
Plan Total Fractions Prescribed: 16
Plan Total Prescribed Dose: 42.56 Gy
Plan Total Prescribed Dose: 42.56 Gy
Reference Point Dosage Given to Date: 31.92 Gy
Reference Point Dosage Given to Date: 31.92 Gy
Reference Point Session Dosage Given: 2.66 Gy
Reference Point Session Dosage Given: 2.66 Gy
Session Number: 12

## 2023-01-18 ENCOUNTER — Ambulatory Visit: Payer: BC Managed Care – PPO

## 2023-01-18 ENCOUNTER — Other Ambulatory Visit: Payer: Self-pay

## 2023-01-18 ENCOUNTER — Ambulatory Visit
Admission: RE | Admit: 2023-01-18 | Discharge: 2023-01-18 | Discharge status: Home / Self Care | Payer: BC Managed Care – PPO | Source: Ambulatory Visit | Attending: Radiation Oncology

## 2023-01-18 DIAGNOSIS — Z510 Encounter for antineoplastic radiation therapy: Secondary | ICD-10-CM | POA: Diagnosis not present

## 2023-01-18 LAB — RAD ONC ARIA SESSION SUMMARY
Course Elapsed Days: 19
Plan Fractions Treated to Date: 13
Plan Fractions Treated to Date: 13
Plan Prescribed Dose Per Fraction: 2.66 Gy
Plan Prescribed Dose Per Fraction: 2.66 Gy
Plan Total Fractions Prescribed: 16
Plan Total Fractions Prescribed: 16
Plan Total Prescribed Dose: 42.56 Gy
Plan Total Prescribed Dose: 42.56 Gy
Reference Point Dosage Given to Date: 34.58 Gy
Reference Point Dosage Given to Date: 34.58 Gy
Reference Point Session Dosage Given: 2.66 Gy
Reference Point Session Dosage Given: 2.66 Gy
Session Number: 13

## 2023-01-20 ENCOUNTER — Other Ambulatory Visit: Payer: Self-pay

## 2023-01-20 ENCOUNTER — Ambulatory Visit
Admission: RE | Admit: 2023-01-20 | Discharge: 2023-01-20 | Disposition: A | Discharge status: Home / Self Care | Payer: BC Managed Care – PPO | Source: Ambulatory Visit | Attending: Radiation Oncology | Admitting: Radiation Oncology

## 2023-01-20 DIAGNOSIS — Z510 Encounter for antineoplastic radiation therapy: Secondary | ICD-10-CM | POA: Diagnosis not present

## 2023-01-20 LAB — RAD ONC ARIA SESSION SUMMARY
Course Elapsed Days: 21
Plan Fractions Treated to Date: 14
Plan Fractions Treated to Date: 14
Plan Prescribed Dose Per Fraction: 2.66 Gy
Plan Prescribed Dose Per Fraction: 2.66 Gy
Plan Total Fractions Prescribed: 16
Plan Total Fractions Prescribed: 16
Plan Total Prescribed Dose: 42.56 Gy
Plan Total Prescribed Dose: 42.56 Gy
Reference Point Dosage Given to Date: 37.24 Gy
Reference Point Dosage Given to Date: 37.24 Gy
Reference Point Session Dosage Given: 2.66 Gy
Reference Point Session Dosage Given: 2.66 Gy
Session Number: 14

## 2023-01-21 ENCOUNTER — Ambulatory Visit
Admission: RE | Admit: 2023-01-21 | Discharge: 2023-01-21 | Disposition: A | Discharge status: Home / Self Care | Payer: BC Managed Care – PPO | Source: Ambulatory Visit | Attending: Radiation Oncology

## 2023-01-21 ENCOUNTER — Ambulatory Visit: Payer: BC Managed Care – PPO | Admitting: Radiation Oncology

## 2023-01-21 ENCOUNTER — Ambulatory Visit: Payer: BC Managed Care – PPO

## 2023-01-21 ENCOUNTER — Other Ambulatory Visit: Payer: Self-pay

## 2023-01-21 DIAGNOSIS — Z510 Encounter for antineoplastic radiation therapy: Secondary | ICD-10-CM | POA: Diagnosis not present

## 2023-01-21 LAB — RAD ONC ARIA SESSION SUMMARY
Course Elapsed Days: 22
Plan Fractions Treated to Date: 15
Plan Fractions Treated to Date: 15
Plan Prescribed Dose Per Fraction: 2.66 Gy
Plan Prescribed Dose Per Fraction: 2.66 Gy
Plan Total Fractions Prescribed: 16
Plan Total Fractions Prescribed: 16
Plan Total Prescribed Dose: 42.56 Gy
Plan Total Prescribed Dose: 42.56 Gy
Reference Point Dosage Given to Date: 39.9 Gy
Reference Point Dosage Given to Date: 39.9 Gy
Reference Point Session Dosage Given: 2.66 Gy
Reference Point Session Dosage Given: 2.66 Gy
Session Number: 15

## 2023-01-24 ENCOUNTER — Other Ambulatory Visit: Payer: Self-pay

## 2023-01-24 ENCOUNTER — Ambulatory Visit: Payer: BC Managed Care – PPO

## 2023-01-24 ENCOUNTER — Ambulatory Visit
Admission: RE | Admit: 2023-01-24 | Discharge: 2023-01-24 | Disposition: A | Discharge status: Home / Self Care | Payer: BC Managed Care – PPO | Source: Ambulatory Visit | Attending: Radiation Oncology | Admitting: Radiation Oncology

## 2023-01-24 DIAGNOSIS — Z510 Encounter for antineoplastic radiation therapy: Secondary | ICD-10-CM | POA: Diagnosis not present

## 2023-01-24 LAB — RAD ONC ARIA SESSION SUMMARY
Course Elapsed Days: 25
Plan Fractions Treated to Date: 16
Plan Fractions Treated to Date: 16
Plan Prescribed Dose Per Fraction: 2.66 Gy
Plan Prescribed Dose Per Fraction: 2.66 Gy
Plan Total Fractions Prescribed: 16
Plan Total Fractions Prescribed: 16
Plan Total Prescribed Dose: 42.56 Gy
Plan Total Prescribed Dose: 42.56 Gy
Reference Point Dosage Given to Date: 42.56 Gy
Reference Point Dosage Given to Date: 42.56 Gy
Reference Point Session Dosage Given: 2.66 Gy
Reference Point Session Dosage Given: 2.66 Gy
Session Number: 16

## 2023-01-25 ENCOUNTER — Ambulatory Visit: Payer: BC Managed Care – PPO

## 2023-01-25 ENCOUNTER — Other Ambulatory Visit: Payer: Self-pay

## 2023-01-25 ENCOUNTER — Inpatient Hospital Stay: Payer: BC Managed Care – PPO | Attending: Hematology | Admitting: Licensed Clinical Social Worker

## 2023-01-25 DIAGNOSIS — Z510 Encounter for antineoplastic radiation therapy: Secondary | ICD-10-CM | POA: Diagnosis not present

## 2023-01-25 DIAGNOSIS — C50411 Malignant neoplasm of upper-outer quadrant of right female breast: Secondary | ICD-10-CM

## 2023-01-25 LAB — RAD ONC ARIA SESSION SUMMARY
Course Elapsed Days: 26
Plan Fractions Treated to Date: 1
Plan Fractions Treated to Date: 1
Plan Prescribed Dose Per Fraction: 2 Gy
Plan Prescribed Dose Per Fraction: 2 Gy
Plan Total Fractions Prescribed: 4
Plan Total Fractions Prescribed: 4
Plan Total Prescribed Dose: 8 Gy
Plan Total Prescribed Dose: 8 Gy
Reference Point Dosage Given to Date: 2 Gy
Reference Point Dosage Given to Date: 2 Gy
Reference Point Session Dosage Given: 2 Gy
Reference Point Session Dosage Given: 2 Gy
Session Number: 17

## 2023-01-25 NOTE — Progress Notes (Signed)
 [facility] CSW Progress Note  Visual Merchandiser  spoke with pt over the phone regarding financial concerns.  Pt reports she will be going to DSS on Thursday to apply for food stamps and believes she will qualify.  CSW informed pt she will need to provide proof of food stamp award and proof of income while she is in active treatment.  Pt will contact CSW after meeting w/ DSS.  CSW also emailed pt the link for the [employer] to [employer].  CSW to remain available as appropriate throughout duration of treatment.        Deborah JONELLE Manna, LCSW Clinical Social Worker [facility]

## 2023-01-27 ENCOUNTER — Other Ambulatory Visit: Payer: Self-pay

## 2023-01-27 ENCOUNTER — Ambulatory Visit: Payer: BC Managed Care – PPO

## 2023-01-27 DIAGNOSIS — C50411 Malignant neoplasm of upper-outer quadrant of right female breast: Secondary | ICD-10-CM | POA: Diagnosis present

## 2023-01-27 DIAGNOSIS — C50412 Malignant neoplasm of upper-outer quadrant of left female breast: Secondary | ICD-10-CM | POA: Insufficient documentation

## 2023-01-27 LAB — RAD ONC ARIA SESSION SUMMARY
Course Elapsed Days: 28
Plan Fractions Treated to Date: 2
Plan Fractions Treated to Date: 2
Plan Prescribed Dose Per Fraction: 2 Gy
Plan Prescribed Dose Per Fraction: 2 Gy
Plan Total Fractions Prescribed: 4
Plan Total Fractions Prescribed: 4
Plan Total Prescribed Dose: 8 Gy
Plan Total Prescribed Dose: 8 Gy
Reference Point Dosage Given to Date: 4 Gy
Reference Point Dosage Given to Date: 4 Gy
Reference Point Session Dosage Given: 2 Gy
Reference Point Session Dosage Given: 2 Gy
Session Number: 18

## 2023-01-28 ENCOUNTER — Ambulatory Visit: Payer: BC Managed Care – PPO

## 2023-01-28 ENCOUNTER — Other Ambulatory Visit: Payer: Self-pay

## 2023-01-28 ENCOUNTER — Telehealth: Payer: Self-pay

## 2023-01-28 ENCOUNTER — Ambulatory Visit
Admission: RE | Admit: 2023-01-28 | Discharge: 2023-01-28 | Disposition: A | Discharge status: Home / Self Care | Payer: BC Managed Care – PPO | Source: Ambulatory Visit | Attending: Hematology | Admitting: Hematology

## 2023-01-28 ENCOUNTER — Ambulatory Visit
Admission: RE | Admit: 2023-01-28 | Discharge: 2023-01-28 | Disposition: A | Discharge status: Home / Self Care | Payer: BC Managed Care – PPO | Source: Ambulatory Visit | Attending: Radiation Oncology

## 2023-01-28 DIAGNOSIS — C50411 Malignant neoplasm of upper-outer quadrant of right female breast: Secondary | ICD-10-CM

## 2023-01-28 DIAGNOSIS — C50412 Malignant neoplasm of upper-outer quadrant of left female breast: Secondary | ICD-10-CM

## 2023-01-28 LAB — RAD ONC ARIA SESSION SUMMARY
Course Elapsed Days: 29
Plan Fractions Treated to Date: 3
Plan Fractions Treated to Date: 3
Plan Prescribed Dose Per Fraction: 2 Gy
Plan Prescribed Dose Per Fraction: 2 Gy
Plan Total Fractions Prescribed: 4
Plan Total Fractions Prescribed: 4
Plan Total Prescribed Dose: 8 Gy
Plan Total Prescribed Dose: 8 Gy
Reference Point Dosage Given to Date: 6 Gy
Reference Point Dosage Given to Date: 6 Gy
Reference Point Session Dosage Given: 2 Gy
Reference Point Session Dosage Given: 2 Gy
Session Number: 19

## 2023-01-28 MED ORDER — RADIAPLEXRX EX GEL
Freq: Once | CUTANEOUS | Status: AC
Start: 2023-01-28 — End: 2023-01-28

## 2023-01-28 NOTE — Telephone Encounter (Signed)
 Notified Patient by voicemail of completion of Benefit Request Form. Fax transmission confirmation received. Copy of form placed for pick-up as requested by Patient. No other needs or concerns noted at this time.

## 2023-01-31 ENCOUNTER — Ambulatory Visit: Payer: BC Managed Care – PPO

## 2023-01-31 ENCOUNTER — Ambulatory Visit
Admission: RE | Admit: 2023-01-31 | Discharge: 2023-01-31 | Disposition: A | Discharge status: Home / Self Care | Payer: BC Managed Care – PPO | Source: Ambulatory Visit | Attending: Radiation Oncology | Admitting: Radiation Oncology

## 2023-01-31 ENCOUNTER — Other Ambulatory Visit: Payer: Self-pay

## 2023-01-31 ENCOUNTER — Ambulatory Visit: Payer: BC Managed Care – PPO | Admitting: Hematology

## 2023-01-31 DIAGNOSIS — C50411 Malignant neoplasm of upper-outer quadrant of right female breast: Secondary | ICD-10-CM | POA: Diagnosis not present

## 2023-01-31 LAB — RAD ONC ARIA SESSION SUMMARY
Course Elapsed Days: 32
Plan Fractions Treated to Date: 4
Plan Fractions Treated to Date: 4
Plan Prescribed Dose Per Fraction: 2 Gy
Plan Prescribed Dose Per Fraction: 2 Gy
Plan Total Fractions Prescribed: 4
Plan Total Fractions Prescribed: 4
Plan Total Prescribed Dose: 8 Gy
Plan Total Prescribed Dose: 8 Gy
Reference Point Dosage Given to Date: 8 Gy
Reference Point Dosage Given to Date: 8 Gy
Reference Point Session Dosage Given: 2 Gy
Reference Point Session Dosage Given: 2 Gy
Session Number: 20

## 2023-02-01 ENCOUNTER — Ambulatory Visit: Payer: BC Managed Care – PPO

## 2023-02-01 ENCOUNTER — Inpatient Hospital Stay: Payer: BC Managed Care – PPO | Admitting: Hematology

## 2023-02-01 NOTE — Radiation Completion Notes (Addendum)
  Radiation Oncology         [phone] ________________________________  Name: Deborah Walter MRN: [id number]  Date of Service: 01/31/2023  DOB: 12-26-1960  End of Treatment Note    Diagnosis: Synchronous Stage IA, pT2N0M0, grade 2, ER/PR positive invasive lobular carcinoma of the left breast, and Stage IA, pT1bN0M0, grade 1, ER/PR positive invasive ductal carcinoma of the right breast.   Intent: Curative     ==========DELIVERED PLANS==========  First Treatment Date: 2022-12-30 Last Treatment Date: 2023-01-31   Plan Name: Breast_L Site: Breast, Left Technique: 3D Mode: Photon Dose Per Fraction: 2.66 Gy Prescribed Dose (Delivered / Prescribed): 42.56 Gy / 42.56 Gy Prescribed Fxs (Delivered / Prescribed): 16 / 16   Plan Name: Breast_L_Bst Site: Breast, Left Technique: 3D Mode: Photon Dose Per Fraction: 2 Gy Prescribed Dose (Delivered / Prescribed): 8 Gy / 8 Gy Prescribed Fxs (Delivered / Prescribed): 4 / 4   Plan Name: Breast_R Site: Breast, Right Technique: 3D Mode: Photon Dose Per Fraction: 2.66 Gy Prescribed Dose (Delivered / Prescribed): 42.56 Gy / 42.56 Gy Prescribed Fxs (Delivered / Prescribed): 16 / 16   Plan Name: Breast_R_Bst Site: Breast, Right Technique: 3D Mode: Photon Dose Per Fraction: 2 Gy Prescribed Dose (Delivered / Prescribed): 8 Gy / 8 Gy Prescribed Fxs (Delivered / Prescribed): 4 / 4     ==========ON TREATMENT VISIT DATES========== 2022-12-31, 2023-01-07, 2023-01-14, 2023-01-28  See weekly On Treatment Notes in Epic for details in the Media tab (listed as Progress notes on the On Treatment Visit Dates listed above). The patient tolerated radiation. She developed fatigue and anticipated skin changes in the treatment field.   The patient will receive a call in about one month from the radiation oncology department. She will continue follow up with Dr. Lanny as well.      Donald KYM Husband, PAC

## 2023-02-03 ENCOUNTER — Encounter: Payer: Self-pay | Admitting: *Deleted

## 2023-02-03 ENCOUNTER — Inpatient Hospital Stay: Payer: BC Managed Care – PPO | Attending: Hematology | Admitting: Licensed Clinical Social Worker

## 2023-02-03 DIAGNOSIS — Z923 Personal history of irradiation: Secondary | ICD-10-CM | POA: Insufficient documentation

## 2023-02-03 DIAGNOSIS — C50412 Malignant neoplasm of upper-outer quadrant of left female breast: Secondary | ICD-10-CM | POA: Insufficient documentation

## 2023-02-03 DIAGNOSIS — M549 Dorsalgia, unspecified: Secondary | ICD-10-CM | POA: Insufficient documentation

## 2023-02-03 DIAGNOSIS — Z1732 Human epidermal growth factor receptor 2 negative status: Secondary | ICD-10-CM | POA: Insufficient documentation

## 2023-02-03 DIAGNOSIS — Z881 Allergy status to other antibiotic agents status: Secondary | ICD-10-CM | POA: Insufficient documentation

## 2023-02-03 DIAGNOSIS — Z1721 Progesterone receptor positive status: Secondary | ICD-10-CM | POA: Insufficient documentation

## 2023-02-03 DIAGNOSIS — Z170 Estrogen receptor positive status [ER+]: Secondary | ICD-10-CM | POA: Insufficient documentation

## 2023-02-03 DIAGNOSIS — C50411 Malignant neoplasm of upper-outer quadrant of right female breast: Secondary | ICD-10-CM | POA: Insufficient documentation

## 2023-02-03 DIAGNOSIS — Z880 Allergy status to penicillin: Secondary | ICD-10-CM | POA: Insufficient documentation

## 2023-02-03 DIAGNOSIS — Z79811 Long term (current) use of aromatase inhibitors: Secondary | ICD-10-CM | POA: Insufficient documentation

## 2023-02-03 DIAGNOSIS — R5383 Other fatigue: Secondary | ICD-10-CM | POA: Insufficient documentation

## 2023-02-03 DIAGNOSIS — N6091 Unspecified benign mammary dysplasia of right breast: Secondary | ICD-10-CM | POA: Insufficient documentation

## 2023-02-03 DIAGNOSIS — Z79899 Other long term (current) drug therapy: Secondary | ICD-10-CM | POA: Insufficient documentation

## 2023-02-03 NOTE — Progress Notes (Signed)
 [facility] CSW Progress Note  Visual Merchandiser  spoke w/ pt by phone.  Pt submitted her award letter for food stamps via email.  Award letter forwarded to [employer] who will apply pt for the [employer].  CSW spoke w/ Director Tinnie Ro who approved pt to utilized grant post treatment as treatment ended last week.  Pt informed of the above and will be contacted by financial resource specialist on 1/13.  CSW to continue to provide support as appropriate.       Deborah JONELLE Manna, Deborah Walter Clinical Social Worker [facility]

## 2023-02-06 NOTE — Assessment & Plan Note (Signed)
 pT1bN0M0, stage IA, invasive ductal carcinoma, ER+/PR+/HER2-, grade 1 -diagnaosed in 08/2022 -s/p lumpectomy and SLN biopsy  -completed adjuvant RT on 01/31/2023 -I recommend adjuvant AI

## 2023-02-06 NOTE — Assessment & Plan Note (Addendum)
 pT2N0M0, stage IA, invasive lobular carcinoma, ER+/PR+/HER2-, Oncotype RS 0 -diagnaosed in 08/2022 -s/p lumpectomy and SLN biopsy  -completed adjuvant RT on 01/31/2023 -I recommend adjuvant AI

## 2023-02-07 ENCOUNTER — Encounter: Payer: Self-pay | Admitting: Hematology

## 2023-02-07 NOTE — Progress Notes (Signed)
 Called pt regarding the [employer].  I went over what the grant covers.  Pt will provide an updated check stub.  Once received I will approve her for the [employer].

## 2023-02-08 ENCOUNTER — Encounter: Payer: Self-pay | Admitting: Hematology

## 2023-02-08 ENCOUNTER — Telehealth: Payer: Self-pay | Admitting: Radiation Oncology

## 2023-02-08 ENCOUNTER — Inpatient Hospital Stay: Payer: BC Managed Care – PPO | Admitting: Hematology

## 2023-02-08 VITALS — BP 137/73 | HR 86 | Temp 98.5°F | Resp 15 | Wt 242.6 lb

## 2023-02-08 DIAGNOSIS — Z79811 Long term (current) use of aromatase inhibitors: Secondary | ICD-10-CM | POA: Diagnosis not present

## 2023-02-08 DIAGNOSIS — Z1721 Progesterone receptor positive status: Secondary | ICD-10-CM | POA: Diagnosis not present

## 2023-02-08 DIAGNOSIS — C50411 Malignant neoplasm of upper-outer quadrant of right female breast: Secondary | ICD-10-CM

## 2023-02-08 DIAGNOSIS — C50412 Malignant neoplasm of upper-outer quadrant of left female breast: Secondary | ICD-10-CM | POA: Diagnosis present

## 2023-02-08 DIAGNOSIS — Z170 Estrogen receptor positive status [ER+]: Secondary | ICD-10-CM | POA: Diagnosis not present

## 2023-02-08 DIAGNOSIS — Z79899 Other long term (current) drug therapy: Secondary | ICD-10-CM | POA: Diagnosis not present

## 2023-02-08 DIAGNOSIS — Z881 Allergy status to other antibiotic agents status: Secondary | ICD-10-CM | POA: Diagnosis not present

## 2023-02-08 DIAGNOSIS — Z1732 Human epidermal growth factor receptor 2 negative status: Secondary | ICD-10-CM | POA: Diagnosis not present

## 2023-02-08 DIAGNOSIS — Z880 Allergy status to penicillin: Secondary | ICD-10-CM | POA: Diagnosis not present

## 2023-02-08 DIAGNOSIS — N6091 Unspecified benign mammary dysplasia of right breast: Secondary | ICD-10-CM | POA: Diagnosis not present

## 2023-02-08 DIAGNOSIS — Z923 Personal history of irradiation: Secondary | ICD-10-CM | POA: Diagnosis not present

## 2023-02-08 DIAGNOSIS — M549 Dorsalgia, unspecified: Secondary | ICD-10-CM | POA: Diagnosis not present

## 2023-02-08 DIAGNOSIS — R5383 Other fatigue: Secondary | ICD-10-CM | POA: Diagnosis not present

## 2023-02-08 MED ORDER — ANASTROZOLE 1 MG PO TABS: 1.0000 mg | ORAL_TABLET | Freq: Every day | ORAL | 3 refills | Status: DC

## 2023-02-08 NOTE — Progress Notes (Signed)
 [facility]   Telephone:([phone]) [phone] [phone]   Clinic Follow up Note   Patient Care Team: Verdie Johann LABOR, DO as PCP - General (Family Medicine) Glean Stephane BROCKS, RN as Oncology Nurse Navigator Tyree Nanetta SAILOR, RN as Oncology Nurse Navigator Lanny Callander, MD as Consulting Physician (Hematology)  Date of Service:  02/08/2023  CHIEF COMPLAINT: f/u of bilateral breast cancer  CURRENT THERAPY:  Pending anastrozole   Oncology History   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, left (HCC) pT2N0M0, stage IA, invasive lobular carcinoma, ER+/PR+/HER2-, Oncotype RS 0 -diagnaosed in 08/2022 -s/p lumpectomy and SLN biopsy  -completed adjuvant RT on 01/31/2023 -I recommend adjuvant AI   Primary malignant neoplasm of upper outer quadrant of right breast (HCC) pT1bN0M0, stage IA, invasive ductal carcinoma, ER+/PR+/HER2-, grade 1 -diagnaosed in 08/2022 -s/p lumpectomy and SLN biopsy  -completed adjuvant RT on 01/31/2023 -I recommend adjuvant AI     Assessment and Plan    Bilateral Breast Cancer, right breast [facility] Completed adjuvant radiation therapy on January 31, 2023. Right breast: invasive ductal carcinoma, low grade, 1 cm, lymph nodes negative. Left breast: lobular carcinoma, larger than right, slightly higher risk of recurrence. Oncotype test shows low risk of recurrence with a recurrence score of 0. No chemotherapy needed. Recommended anti-estrogen therapy with anastrozole  for 10 years due to lobular carcinoma.  --The potential benefit and side effects, which includes but not limited to, hot flash, skin and vaginal dryness, metabolic changes ( increased blood glucose, cholesterol, weight, etc.), slightly in increased risk of cardiovascular disease, cataracts, muscular and joint discomfort, osteopenia and osteoporosis, etc, were discussed with her in great details. She is interested, and we'll start in a few weeks  Discussed alternative medications if  severe joint pain occurs. Patient has treatment-related fatigue and skin irritation from radiation. Informed that anastrozole  has a 40% risk of joint pain and potential bone weakening. Discussed alternative medications like tamoxifen  and exemestane if severe joint pain or osteoporosis is present. Explained that anti-estrogen therapy reduces the risk of distant recurrence to 3% over the next nine years. - Prescribe anastrozole , 1 mg daily - Order bone density scan - Recommend starting anastrozole  towards the end of January or early February - Provide a letter for work extension until the end of February - Schedule follow-up in three months with nurse practitioner - Schedule six-month follow-up with physician  Bone Health Monitoring No prior bone density scan. Important to establish baseline bone density before starting anastrozole  due to potential bone weakening effects. Patient has trauma-related shoulder surgery and back pain with two ruptured discs. - Order bone density scan - Discuss results and potential need for alternative medication if osteoporosis is present  General Health Maintenance Needs regular follow-up for breast cancer surveillance. Discussed the importance of annual mammograms and breast MRIs every six months due to higher risk of recurrence with lobular carcinoma. - Schedule annual mammogram - Schedule annual breast MRI, six months apart from mammogram  Plan -I recommend anastrozole  1 mg daily, prescription called in today, she was started in the next few weeks - Schedule follow-up in three months with nurse practitioner for survivorship - Schedule six-month follow-up with me - Ensure bone density scan and mammogram are scheduled at the breast center.         SUMMARY OF ONCOLOGIC HISTORY: Oncology History  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, left (HCC)  09/21/2022 Initial Diagnosis   Primary malignant neoplasm of upper outer quadrant of  breast, left (HCC)   09/21/2022 Cancer Staging   Staging form: Breast, AJCC 8th Edition - Clinical stage from 09/21/2022: Stage IA (cT1b, cN0, cM0, G2, ER+, PR+, HER2-) - Signed by Lanell Donald Stagger, PA-C on 09/21/2022 Stage prefix: Initial diagnosis Method of lymph node assessment: Clinical Histologic grading system: 3 grade system   12/13/2022 Cancer Staging   Staging form: Breast, AJCC 8th Edition - Pathologic stage from 12/13/2022: Stage IA (pT2, pN0(sn), cM0, G2, ER+, PR+, HER2-, Oncotype DX score: 0) - Signed by Lanell Donald Stagger, PA-C on 12/13/2022 Stage prefix: Initial diagnosis Method of lymph node assessment: Sentinel lymph node biopsy Multigene prognostic tests performed: Oncotype DX Recurrence score range: Less than 11 Histologic grading system: 3 grade system   Primary malignant neoplasm of upper outer quadrant of right breast (HCC)  09/21/2022 Initial Diagnosis   Primary malignant neoplasm of upper outer quadrant of right breast (HCC)   12/13/2022 Cancer Staging   Staging form: Breast, AJCC 8th Edition - Pathologic stage from 12/13/2022: Stage IA (pT1b, pN0(sn), cM0, G1, ER+, PR+, HER2-) - Signed by Lanell Donald Stagger, PA-C on 12/13/2022 Stage prefix: Initial diagnosis Method of lymph node assessment: Sentinel lymph node biopsy Multigene prognostic tests performed: None Histologic grading system: 3 grade system      Discussed the use of AI scribe software for clinical note transcription with the patient, who gave verbal consent to proceed.  History of Present Illness   The patient, a 63 year old female with a history of bilateral breast cancer, presents for a follow-up visit after recently completing radiation therapy. The patient reports experiencing sunburn-like symptoms in the treated areas, which is common post-radiation. The patient also mentions a groove in the skin, which she was told would eventually disappear. The patient has been hesitant to rub  the area due to discomfort. The patient also mentions a history of back pain due to two ruptured discs.  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 The patient has a history of shoulder surgery related to a trauma incident. The patient also mentions a history of arthritis in the back.         All other systems were reviewed with the patient and are negative.  MEDICAL HISTORY:  Past Medical History:  Diagnosis Date   Acid reflux    Arthritis    osteoarthritis   Elevated cholesterol    Family history of adverse reaction to anesthesia    mother- hard to wake her up   H/O seasonal allergies    Hypertension    Urticaria     SURGICAL HISTORY: Past Surgical History:  Procedure Laterality Date   BREAST BIOPSY Right 10/11/2022   MM RT BREAST BX W LOC DEV 1ST LESION IMAGE BX SPEC STEREO GUIDE 10/11/2022 [facility] MAMMOGRAP[facility]   BREAST BIOPSY  11/09/2022   US  RT RADIOACTIVE SEED LOC 11/09/2022 [facility] ULTRASOUND   BREAST BIOPSY Left 11/09/2022   US  LT RADIOACTIVE SEED LOC 11/09/2022 [facility] ULTRASOUND   BREAST BIOPSY Left 11/09/2022   US  LT RADIOACTIVE SEED LOC 11/09/2022 [facility] ULTRASOUND   BREAST BIOPSY  11/09/2022   MM LT RADIOACTIVE SEED LOC MAMMO GUIDE 11/09/2022 [facility]AMMO[facility]   BREAST BIOPSY  11/09/2022   MM RT RADIOACTIVE SEED LOC MAMMO GUIDE 11/09/2022 [facility] MAMMO[facility]   BREAST LUMPECTOMY WITH RADIOACTIVE SEED AND SENTINEL LYMPH NODE BIOPSY Bilateral 11/10/2022   Procedure: BILATERAL RADIOACTIVE SEED GUIDED  LUMPECTOMY WITH BILATERAL SENTINEL NODE BIOPSY;  Surgeon: Vernetta Berg, MD;  Location: [facility];  Service: General;  Laterality: Bilateral;  LMA PEC BLOCK   CLAVICLE SURGERY Right Early 1990   COLONOSCOPY WITH PROPOFOL  N/A 02/27/2018   Procedure: COLONOSCOPY WITH PROPOFOL ;  Surgeon: Shaaron Lamar HERO, MD;  Location: [facility];   Service: Endoscopy;  Laterality: N/A;  8:30am   CYSTECTOMY  Early 1990   Bottom of spine   NASAL SEPTOPLASTY W/ TURBINOPLASTY Bilateral 09/01/2015   Procedure: NASAL SEPTOPLASTY WITH TURBINATE REDUCTION;  Surgeon: Daniel Moccasin, MD;  Location: [facility];  Service: ENT;  Laterality: Bilateral;   POLYPECTOMY  02/27/2018   Procedure: POLYPECTOMY;  Surgeon: Shaaron Lamar HERO, MD;  Location: [facility];  Service: Endoscopy;;  hepatic flexure, rectum   SINOSCOPY     SINUS ENDO WITH FUSION Left 09/01/2015   Procedure: LEFT ENDOSCOPIC TOTAL ETHMOIDECTOMY LEFT ENDOSCOPIC  MAXILLARY ANTROSTOMY LEFT ENDOSCOPIC FRONTAL RECESS EXPLORATION;  Surgeon: Daniel Moccasin, MD;  Location:  SURGERY CENTER;  Service: ENT;  Laterality: Left;   TOTAL SHOULDER ARTHROPLASTY Right 11/09/2018   Procedure: TOTAL SHOULDER ARTHROPLASTY;  Surgeon: Dozier Soulier, MD;  Location: [facility];  Service: Orthopedics;  Laterality: Right;   TUBAL LIGATION  1982    I have reviewed the social history and family history with the patient and they are unchanged from previous note.  ALLERGIES:  is allergic to penicillins, atorvastatin, diclofenac, dust mite extract, gramineae pollens, other, pravastatin, amoxicillin, cefuroxime, and tizanidine  hcl.  MEDICATIONS:  Current Outpatient Medications  Medication Sig Dispense Refill   anastrozole  (ARIMIDEX ) 1 MG tablet Take 1 tablet (1 mg total) by mouth daily. 30 tablet 3   Ascorbic Acid (VITAMIN C) 125 MG CHEW Chew by mouth.     Biotin 10 MG CAPS Take 10,000 capsules by mouth daily.     Cholecalciferol (VITAMIN D3) 250 MCG (10000 UT) capsule Take 10,000 Units by mouth daily.     ezetimibe (ZETIA) 10 MG tablet Take 10 mg by mouth daily.     fluticasone  (FLONASE ) 50 MCG/ACT nasal spray Place 2 sprays into both nostrils daily. 16 g 5   levocetirizine (XYZAL ) 5 MG tablet Take 1 tablet (5 mg total) by mouth every evening. 30 tablet 5   lisinopril -hydrochlorothiazide  (PRINZIDE ,ZESTORETIC )  20-25 MG tablet Take 1 tablet by mouth daily.     Omega-3 Fatty Acids (FISH OIL) 1000 MG CAPS Take by mouth.     omeprazole (PRILOSEC) 20 MG capsule Take 20 mg by mouth daily.   0   oxyCODONE -acetaminophen  (PERCOCET) 5-325 MG tablet Take 1-2 tablets every 4 hours as needed for post operative pain. MAX 6/day 30 tablet 0   PROAIR  HFA 108 (90 Base) MCG/ACT inhaler INHALE 2 PUFFS BY MOUTH EVERY 6 HOURS AS NEEDED FOR WHEEZING FOR SHORTNESS OF BREATH 18 g 2   vitamin B-12 (CYANOCOBALAMIN) 100 MCG tablet Take 100 mcg by mouth daily.     No current facility-administered medications for this visit.    PHYSICAL EXAMINATION: ECOG PERFORMANCE STATUS: 1 - Symptomatic but completely ambulatory  Vitals:   02/08/23 0854  BP: 137/73  Pulse: 86  Resp: 15  Temp: 98.5 F (36.9 C)  SpO2: 96%   Wt Readings from Last 3 Encounters:  02/08/23 242 lb 9.6 oz (110 kg)  12/16/22 245 lb 3.2 oz (111.2 kg)  11/10/22 235 lb 0.2 oz (106.6 kg)     GENERAL:alert, no distress and comfortable SKIN: skin color, texture, turgor are  normal, no rashes or significant lesions EYES: normal, Conjunctiva are pink and non-injected, sclera clear NECK: supple, thyroid  normal size, non-tender, without nodularity LYMPH:  no palpable lymphadenopathy in the cervical, axillary  LUNGS: clear to auscultation and percussion with normal breathing effort HEART: regular rate & rhythm and no murmurs and no lower extremity edema ABDOMEN:abdomen soft, non-tender and normal bowel sounds Musculoskeletal:no cyanosis of digits and no clubbing  NEURO: alert & oriented x 3 with fluent speech, no focal motor/sensory deficits BREAST: Status post bilateral lumpectomy, diffuse skin erythema and hyperpigmentation due to radiation.  Palpable groove on one side. ,ild Tenderness upon palpation in the area of a previous biopsy site. SKIN: Erythema and sunburn-like appearance on bilateral breast areas, more pronounced on one side.      LABORATORY DATA:   I have reviewed the data as listed    Latest Ref Rng & Units 09/21/2022   12:01 PM 11/10/2018    3:05 AM 02/22/2018    2:03 PM  CBC  WBC 4.0 - 10.5 K/uL 7.4  11.8  6.1   Hemoglobin 12.0 - 15.0 g/dL 86.4  88.0  86.6   Hematocrit 36.0 - 46.0 % 39.5  37.7  42.2   Platelets 150 - 400 K/uL 285  239  265         Latest Ref Rng & Units 09/21/2022   12:01 PM 11/10/2018    3:05 AM 02/22/2018    2:03 PM  CMP  Glucose 70 - 99 mg/dL 884  864  888   BUN 8 - 23 mg/dL 29  20  20    Creatinine 0.44 - 1.00 mg/dL 8.93  9.09  8.95   Sodium 135 - 145 mmol/L 138  140  136   Potassium 3.5 - 5.1 mmol/L 4.4  5.3  3.8   Chloride 98 - 111 mmol/L 101  105  101   CO2 22 - 32 mmol/L 29  25  28    Calcium 8.9 - 10.3 mg/dL 89.8  9.4  9.0   Total Protein 6.5 - 8.1 g/dL 7.7     Total Bilirubin 0.3 - 1.2 mg/dL 0.6     Alkaline Phos 38 - 126 U/L 80     AST 15 - 41 U/L 26     ALT 0 - 44 U/L 26         RADIOGRAPHIC STUDIES: I have personally reviewed the radiological images as listed and agreed with the findings in the report. No results found.    Orders Placed This Encounter  Procedures   DG Bone Density    Standing Status:   Future    Expected Date:   03/11/2023    Expiration Date:   02/08/2024    Reason for Exam (SYMPTOM  OR DIAGNOSIS REQUIRED):   SCREENING    Preferred imaging location?:   [facility]   All questions were answered. The patient knows to call the clinic with any problems, questions or concerns. No barriers to learning was detected. The total time spent in the appointment was 40 minutes.     Onita Mattock, MD 02/08/2023

## 2023-02-08 NOTE — Progress Notes (Signed)
 Pt is approved for the $1000 [employer].

## 2023-02-08 NOTE — Telephone Encounter (Signed)
 1/14 Patient stop by our office to pick up paperwork to help with her disability. Called Rosalind's office spoke to [place].  She stated some paperwork was left at Registration for pick-up.  Patient is aware.

## 2023-02-28 ENCOUNTER — Ambulatory Visit
Admission: RE | Admit: 2023-02-28 | Discharge: 2023-02-28 | Disposition: A | Discharge status: Home / Self Care | Payer: BC Managed Care – PPO | Source: Ambulatory Visit | Attending: Radiation Oncology | Admitting: Radiation Oncology

## 2023-02-28 ENCOUNTER — Telehealth: Payer: Self-pay

## 2023-02-28 NOTE — Progress Notes (Addendum)
  Radiation Oncology         [phone]) [phone] ________________________________  Name: Deborah Walter MRN: [id number]  Date of Service: 02/28/2023  DOB: 08-23-1960  Post Treatment Telephone Note  Diagnosis:  Synchronous Stage IA, pT2N0M0, grade 2, ER/PR positive invasive lobular carcinoma of the left breast, and Stage IA, pT1bN0M0, grade 1, ER/PR positive invasive ductal carcinoma of the right breast. (as documented in provider EOT note)  The patient was available for call today.   Symptoms of fatigue have improved since completing therapy.  Symptoms of skin changes have improved since completing therapy.  Patient reports RT shoulder pain 3/10 w/ limited ROM that begin during her radiation Tx's. This shoulder was completely replaced 11/09/2018. Oxycodone 5-325mg  is helping.   The patient was encouraged to avoid sun exposure in the area of prior treatment for up to one year following radiation with either sunscreen or by the style of clothing worn in the sun.  The patient has scheduled follow up with her medical oncologist Dr. Mosetta Putt for ongoing surveillance, and was encouraged to call if she develops concerns or questions regarding radiation.   This concludes the interaction.  Ruel Favors, LPN

## 2023-02-28 NOTE — Telephone Encounter (Signed)
Left voicemail notifying patient that her Attending physician statement documents had been completed and faxed back to company. Fax confirmation received.

## 2023-03-29 ENCOUNTER — Telehealth: Payer: Self-pay | Admitting: Plastic Surgery

## 2023-03-29 NOTE — Telephone Encounter (Signed)
 Just called pt left vmail to sch with Dr Ulice Bold, rec message from Dr Magnus Ivan to sch with Dr D.

## 2023-05-05 ENCOUNTER — Encounter: Payer: Self-pay | Admitting: Plastic Surgery

## 2023-05-05 ENCOUNTER — Ambulatory Visit: Admitting: Plastic Surgery

## 2023-05-05 VITALS — BP 159/133 | HR 85 | Ht 68.0 in | Wt 242.4 lb

## 2023-05-05 DIAGNOSIS — Z853 Personal history of malignant neoplasm of breast: Secondary | ICD-10-CM

## 2023-05-05 DIAGNOSIS — N6489 Other specified disorders of breast: Secondary | ICD-10-CM

## 2023-05-05 DIAGNOSIS — L905 Scar conditions and fibrosis of skin: Secondary | ICD-10-CM | POA: Diagnosis not present

## 2023-05-05 DIAGNOSIS — Z923 Personal history of irradiation: Secondary | ICD-10-CM

## 2023-05-05 DIAGNOSIS — C50411 Malignant neoplasm of upper-outer quadrant of right female breast: Secondary | ICD-10-CM

## 2023-05-05 NOTE — Progress Notes (Signed)
 Referring Provider Lindaann Slough, DO [address] [place],  [place] [address]   CC:  Chief Complaint  Patient presents with   Advice Only      Deborah Walter is an 63 y.o. female.  HPI: Deborah Walter is a 63 year old female who is referred today for evaluation and possible treatment of breast asymmetry after bilateral lumpectomies and bilateral breast radiation.  She underwent bilateral lumpectomy and bilateral sentinel node biopsy by Dr. Magnus Ivan in October 2024.  This was followed by radiation which completed in January 2025.  She is now bothered by a contour defect above the left nipple and areolar complex and asymmetric nipple areolar complexes with the right pointing more lateral and down.  Allergies  Allergen Reactions   Penicillins Swelling    Facial swelling and severe itching   Atorvastatin Other (See Comments)   Diclofenac     Caused dry mouth   Dust Mite Extract    Gramineae Pollens     unknown   Other     ALMONDS.  CAUSE MIGRAINE HEADACHE unknown   Pravastatin Other (See Comments)   Amoxicillin Swelling    Facial swelling and severe itching   Cefuroxime Rash   Tizanidine Hcl Palpitations    Outpatient Encounter Medications as of 05/05/2023  Medication Sig   Ascorbic Acid (VITAMIN C) 125 MG CHEW Chew by mouth.   Biotin 10 MG CAPS Take 10,000 capsules by mouth daily.   Cholecalciferol (VITAMIN D3) 250 MCG (10000 UT) capsule Take 10,000 Units by mouth daily.   ezetimibe (ZETIA) 10 MG tablet Take 10 mg by mouth daily.   fluticasone (FLONASE) 50 MCG/ACT nasal spray Place 2 sprays into both nostrils daily.   lisinopril-hydrochlorothiazide (PRINZIDE,ZESTORETIC) 20-25 MG tablet Take 1 tablet by mouth daily.   omeprazole (PRILOSEC) 20 MG capsule Take 20 mg by mouth daily.    oxyCODONE-acetaminophen (PERCOCET) 5-325 MG tablet Take 1-2 tablets every 4 hours as needed for post operative pain. MAX 6/day   PROAIR HFA 108 (90 Base) MCG/ACT inhaler INHALE 2  PUFFS BY MOUTH EVERY 6 HOURS AS NEEDED FOR WHEEZING FOR SHORTNESS OF BREATH   vitamin B-12 (CYANOCOBALAMIN) 100 MCG tablet Take 100 mcg by mouth daily.   anastrozole (ARIMIDEX) 1 MG tablet Take 1 tablet (1 mg total) by mouth daily. (Patient not taking: Reported on 05/05/2023)   levocetirizine (XYZAL) 5 MG tablet Take 1 tablet (5 mg total) by mouth every evening. (Patient not taking: Reported on 05/05/2023)   Omega-3 Fatty Acids (FISH OIL) 1000 MG CAPS Take by mouth. (Patient not taking: Reported on 05/05/2023)   No facility-administered encounter medications on file as of 05/05/2023.     Past Medical History:  Diagnosis Date   Acid reflux    Arthritis    osteoarthritis   Elevated cholesterol    Family history of adverse reaction to anesthesia    mother- hard to wake her up   H/O seasonal allergies    Hypertension    Urticaria     Past Surgical History:  Procedure Laterality Date   BREAST BIOPSY Right 10/11/2022   MM RT BREAST BX W LOC DEV 1ST LESION IMAGE BX SPEC STEREO GUIDE 10/11/2022 [facility]   BREAST BIOPSY  11/09/2022   [place] RT RADIOACTIVE SEED LOC 11/09/2022 [facility] ULTRASOUND   BREAST BIOPSY Left 11/09/2022   [place] LT RADIOACTIVE SEED LOC 11/09/2022 [facility] ULTRASOUND   BREAST BIOPSY Left 11/09/2022   [place] LT RADIOACTIVE SEED LOC 11/09/2022  [facility]   BREAST BIOPSY  11/09/2022   MM LT RADIOACTIVE SEED LOC MAMMO GUIDE 11/09/2022 [facility]   BREAST BIOPSY  11/09/2022   MM RT RADIOACTIVE SEED LOC MAMMO GUIDE 11/09/2022 [facility]   BREAST LUMPECTOMY WITH RADIOACTIVE SEED AND SENTINEL LYMPH NODE BIOPSY Bilateral 11/10/2022   Procedure: BILATERAL RADIOACTIVE SEED GUIDED LUMPECTOMY WITH BILATERAL SENTINEL NODE BIOPSY;  Surgeon: Abigail Miyamoto, MD;  Location: [facility];  Service: General;  Laterality: Bilateral;  LMA PEC BLOCK   CLAVICLE SURGERY Right Early 1990   COLONOSCOPY WITH PROPOFOL N/A 02/27/2018   Procedure: COLONOSCOPY WITH  PROPOFOL;  Surgeon: Corbin Ade, MD;  [facility];  Service: Endoscopy;  Laterality: N/A;  8:30am   CYSTECTOMY  Early 1990   Bottom of spine   NASAL SEPTOPLASTY W/ TURBINOPLASTY Bilateral 09/01/2015   Procedure: NASAL SEPTOPLASTY WITH TURBINATE REDUCTION;  Surgeon: Newman Pies, MD;  Location: [facility];  Service: ENT;  Laterality: Bilateral;   POLYPECTOMY  02/27/2018   Procedure: POLYPECTOMY;  Surgeon: Corbin Ade, MD;  [facility];  Service: Endoscopy;;  hepatic flexure, rectum   SINOSCOPY     SINUS ENDO WITH FUSION Left 09/01/2015   Procedure: LEFT ENDOSCOPIC TOTAL ETHMOIDECTOMY LEFT ENDOSCOPIC  MAXILLARY ANTROSTOMY LEFT ENDOSCOPIC FRONTAL RECESS EXPLORATION;  Surgeon: Newman Pies, MD;  Location: [facility];  Service: ENT;  Laterality: Left;   TOTAL SHOULDER ARTHROPLASTY Right 11/09/2018   Procedure: TOTAL SHOULDER ARTHROPLASTY;  Surgeon: Jones Broom, MD;  [facility];  Service: Orthopedics;  Laterality: Right;   TUBAL LIGATION  1982    Family History  Problem Relation Age of Onset   Asthma Father    Asthma Sister    Cancer Paternal Aunt        LUNG CANCER   Eczema Neg Hx    Immunodeficiency Neg Hx    Urticaria Neg Hx    Angioedema Neg Hx    Atopy Neg Hx    Colon cancer Neg Hx    Colon polyps Neg Hx     Social History   Social History Narrative   Not on file     Review of Systems General: Denies fevers, chills, weight loss CV: Denies chest pain, shortness of breath, palpitations Breast: Patient is bothered by a contour defect in the left and nipple asymmetry.  No specific pain, no nipple discharge  Physical Exam    05/05/2023    1:53 PM 02/08/2023    8:54 AM 12/16/2022    1:03 PM  Vitals with BMI  Height 5\' 8"   5\' 9"   Weight 242 lbs 6 oz 242 lbs 10 oz 245 lbs 3 oz  BMI 36.87  36.19  Systolic 159 137 191  Diastolic 133 73 80  Pulse 85 86 109    General:  No acute distress,  Alert and oriented, Non-Toxic,  Normal speech and affect Breast: Well-healed incisions on both breast.  There is erythema of both breast.  There is a small contour defect above the left areola complex and the right nipple is positioned in a more lateral inferior position.  The overall symmetry of the breasts is quite nice and is unlikely to be improved. Mammogram: No new mammogram since treatment for her breast cancer Assessment/Plan Status post bilateral breast lumpectomies: Patient has had radiation of both breast and there are limited options for management of her concerns.  I do believe that fat transfer to the contour defect and nipple  repositioning may be possible with limited risk.  I would prefer to wait until she is at least 1 year out from her last radiation treatment.  She and I have discussed this at length and she is in agreement.  She will follow-up with me in January 2026 at which time we will revisit her concerns and move forward with surgery as indicated.  Santiago Glad 05/05/2023, 2:41 PM

## 2023-05-16 ENCOUNTER — Inpatient Hospital Stay: Payer: BC Managed Care – PPO | Attending: Hematology | Admitting: Nurse Practitioner

## 2023-05-16 ENCOUNTER — Encounter: Payer: Self-pay | Admitting: Nurse Practitioner

## 2023-05-16 VITALS — BP 128/80 | HR 84 | Temp 97.3°F | Resp 15 | Wt 248.1 lb

## 2023-05-16 DIAGNOSIS — Z122 Encounter for screening for malignant neoplasm of respiratory organs: Secondary | ICD-10-CM

## 2023-05-16 DIAGNOSIS — Z79899 Other long term (current) drug therapy: Secondary | ICD-10-CM | POA: Insufficient documentation

## 2023-05-16 DIAGNOSIS — Z1721 Progesterone receptor positive status: Secondary | ICD-10-CM | POA: Insufficient documentation

## 2023-05-16 DIAGNOSIS — Z1732 Human epidermal growth factor receptor 2 negative status: Secondary | ICD-10-CM | POA: Diagnosis not present

## 2023-05-16 DIAGNOSIS — Z87891 Personal history of nicotine dependence: Secondary | ICD-10-CM | POA: Diagnosis not present

## 2023-05-16 DIAGNOSIS — Z170 Estrogen receptor positive status [ER+]: Secondary | ICD-10-CM | POA: Diagnosis not present

## 2023-05-16 DIAGNOSIS — Z79811 Long term (current) use of aromatase inhibitors: Secondary | ICD-10-CM | POA: Insufficient documentation

## 2023-05-16 DIAGNOSIS — C50412 Malignant neoplasm of upper-outer quadrant of left female breast: Secondary | ICD-10-CM | POA: Diagnosis not present

## 2023-05-16 DIAGNOSIS — C50411 Malignant neoplasm of upper-outer quadrant of right female breast: Secondary | ICD-10-CM | POA: Insufficient documentation

## 2023-05-16 MED ORDER — TAMOXIFEN CITRATE 20 MG PO TABS: 20.0000 mg | ORAL_TABLET | Freq: Every day | ORAL | 6 refills | Status: AC

## 2023-05-16 NOTE — Progress Notes (Signed)
 CLINIC:  Survivorship   Patient Care Team: Hershel Los, DO as PCP - General (Family Medicine) Auther Bo, RN as Oncology Nurse Navigator Alane Hsu, RN as Oncology Nurse Navigator Sonja [place], MD as Consulting Physician (Hematology) Jacquelyn Shadrick K, NP as Nurse Practitioner (Nurse Practitioner) Oza Blumenthal, MD as Consulting Physician (General Surgery) Johna Myers, MD as Consulting Physician (Radiation Oncology)   REASON FOR VISIT:  Routine follow-up post-treatment for a recent history of breast cancer.  BRIEF ONCOLOGIC HISTORY:  Oncology History  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, left (HCC)  09/21/2022 Initial Diagnosis   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, left (HCC)   09/21/2022 Cancer Staging   Staging form: Breast, AJCC 8th Edition - Clinical stage from 09/21/2022: Stage IA (cT1b, cN0, cM0, G2, ER+, PR+, HER2-) - Signed by Bettejane Brownie, PA-C on 09/21/2022 Stage prefix: Initial diagnosis Method of lymph node assessment: Clinical Histologic grading system: 3 grade system   12/13/2022 Cancer Staging   Staging form: Breast, AJCC 8th Edition - Pathologic stage from 12/13/2022: Stage IA (pT2, pN0(sn), cM0, G2, ER+, PR+, HER2-, Oncotype DX score: 0) - Signed by Bettejane Brownie, PA-C on 12/13/2022 Stage prefix: Initial diagnosis Method of lymph node assessment: Sentinel lymph node biopsy Multigene prognostic tests performed: Oncotype DX Recurrence score range: Less than 11 Histologic grading system: 3 grade system   Primary malignant neoplasm of upper outer quadrant of right breast (HCC)  09/21/2022 Initial Diagnosis   Primary malignant neoplasm of upper outer quadrant of right breast (HCC)   12/13/2022 Cancer Staging   Staging form: Breast, AJCC 8th Edition - Pathologic stage from 12/13/2022: Stage IA (pT1b, pN0(sn), cM0, G1, ER+, PR+, HER2-) - Signed by Bettejane Brownie, PA-C on  12/13/2022 Stage prefix: Initial diagnosis Method of lymph node assessment: Sentinel lymph node biopsy Multigene prognostic tests performed: None Histologic grading system: 3 grade system     INTERVAL HISTORY:  Deborah Walter presents to the Survivorship Clinic today for our initial meeting to review her survivorship care plan detailing her treatment course for breast cancer, as well as monitoring long-term side effects of that treatment, education regarding health maintenance, screening, and overall wellness and health promotion.     Overall, Deborah Walter reports feeling ok.  She took 1 dose of anastrozole  and developed severe left leg pain up to her hip, she stopped after 1 dose and the pain has not recurred.  She is reluctant to consider additional antiestrogen therapy, highly values quality of life over quantity.  She had miserable hot flashes with menopause and does not want to deal with that again.  Also notes her cholesterol has increased and she is intolerant to statins, also has a long list of other med allergies.  She is not able to exercise much due to her back pain, currently awaiting a course of physical therapy.    REVIEW OF SYSTEMS:  Review of Systems - Oncology Breast: Denies any new nodularity, masses, tenderness, nipple changes, or nipple discharge.      ONCOLOGY TREATMENT TEAM:  1. Surgeon:  Dr. Lucienne Ryder at [facility] 2. Medical Oncologist: Dr. Maryalice Smaller  3. Radiation Oncologist: Dr. Jeryl Moris    PAST MEDICAL/SURGICAL HISTORY:  Past Medical History:  Diagnosis Date   Acid reflux    Arthritis    osteoarthritis   Elevated cholesterol    Family history of adverse reaction to anesthesia    mother- hard to wake her up  H/O seasonal allergies    Hypertension    Urticaria    Past Surgical History:  Procedure Laterality Date   BREAST BIOPSY Right 10/11/2022   MM RT BREAST BX W LOC DEV 1ST LESION IMAGE BX SPEC STEREO GUIDE 10/11/2022 [facility]   BREAST BIOPSY   11/09/2022   US  RT RADIOACTIVE SEED LOC 11/09/2022 [facility] ULTRASOUND   BREAST BIOPSY Left 11/09/2022   US  LT RADIOACTIVE SEED LOC 11/09/2022 [facility] ULTRASOUND   BREAST BIOPSY Left 11/09/2022   US  LT RADIOACTIVE SEED LOC 11/09/2022 [facility]TRAS[facility]   BREAST BIOPSY  11/09/2022   MM LT RADIOACTIVE SEED LOC MAMMO GUIDE 11/09/2022 [facility]   BREAST BIOPSY  11/09/2022   MM RT RADIOACTIVE SEED LOC MAMMO GUIDE 11/09/2022 [facility]   BREAST LUMPECTOMY WITH RADIOACTIVE SEED AND SENTINEL LYMPH NODE BIOPSY Bilateral 11/10/2022   Procedure: BILATERAL RADIOACTIVE SEED GUIDED LUMPECTOMY WITH BILATERAL SENTINEL NODE BIOPSY;  Surgeon: Oza Blumenthal, MD;  Location: [facility];  Service: General;  Laterality: Bilateral;  LMA PEC BLOCK   CLAVICLE SURGERY Right Early 1990   COLONOSCOPY WITH PROPOFOL  N/A 02/27/2018   Procedure: COLONOSCOPY WITH PROPOFOL ;  Surgeon: Suzette Espy, MD;  [facility];  Service: Endoscopy;  Laterality: N/A;  8:30am   CYSTECTOMY  Early 1990   Bottom of spine   NASAL SEPTOPLASTY W/ TURBINOPLASTY Bilateral 09/01/2015   Procedure: NASAL SEPTOPLASTY WITH TURBINATE REDUCTION;  Surgeon: Reynold Caves, MD;  Location: [facility];  Service: ENT;  Laterality: Bilateral;   POLYPECTOMY  02/27/2018   Procedure: POLYPECTOMY;  Surgeon: Suzette Espy, MD;  [facility];  Service: Endoscopy;;  hepatic flexure, rectum   SINOSCOPY     SINUS ENDO WITH FUSION Left 09/01/2015   Procedure: LEFT ENDOSCOPIC TOTAL ETHMOIDECTOMY LEFT ENDOSCOPIC  MAXILLARY ANTROSTOMY LEFT ENDOSCOPIC FRONTAL RECESS EXPLORATION;  Surgeon: Reynold Caves, MD;  Location: [facility];  Service: ENT;  Laterality: Left;   TOTAL SHOULDER ARTHROPLASTY Right 11/09/2018   Procedure: TOTAL SHOULDER ARTHROPLASTY;  Surgeon: Sammye Cristal, MD;  Location: [facility];  Service: Orthopedics;  Laterality: Right;   TUBAL LIGATION  1982     ALLERGIES:  Allergies  Allergen  Reactions   Penicillins Swelling    Facial swelling and severe itching   Atorvastatin Other (See Comments)   Diclofenac     Caused dry mouth   Dust Mite Extract    Gramineae Pollens     unknown   Other     ALMONDS.  CAUSE MIGRAINE HEADACHE unknown   Pravastatin Other (See Comments)   Amoxicillin Swelling    Facial swelling and severe itching   Cefuroxime Rash   Tizanidine  Hcl Palpitations     CURRENT MEDICATIONS:  Outpatient Encounter Medications as of 05/16/2023  Medication Sig   Ascorbic Acid (VITAMIN C) 125 MG CHEW Chew by mouth.   Biotin 10 MG CAPS Take 10,000 capsules by mouth daily.   Cholecalciferol (VITAMIN D3) 250 MCG (10000 UT) capsule Take 10,000 Units by mouth daily.   ezetimibe (ZETIA) 10 MG tablet Take 10 mg by mouth daily.   fluticasone  (FLONASE ) 50 MCG/ACT nasal spray Place 2 sprays into both nostrils daily.   lisinopril -hydrochlorothiazide  (PRINZIDE ,ZESTORETIC ) 20-25 MG tablet Take 1 tablet by mouth daily.   omeprazole (PRILOSEC) 20 MG capsule Take 20 mg by mouth daily.    oxyCODONE -acetaminophen  (PERCOCET) 5-325 MG tablet Take 1-2 tablets every 4 hours as needed for post operative pain. MAX 6/day   pregabalin (LYRICA)  75 MG capsule Take 1 capsule by mouth 2 (two) times daily.   PROAIR  HFA 108 (90 Base) MCG/ACT inhaler INHALE 2 PUFFS BY MOUTH EVERY 6 HOURS AS NEEDED FOR WHEEZING FOR SHORTNESS OF BREATH   tamoxifen  (NOLVADEX ) 20 MG tablet Take 1 tablet (20 mg total) by mouth daily.   vitamin B-12 (CYANOCOBALAMIN) 100 MCG tablet Take 100 mcg by mouth daily.   levocetirizine (XYZAL ) 5 MG tablet Take 1 tablet (5 mg total) by mouth every evening. (Patient not taking: Reported on 05/16/2023)   Omega-3 Fatty Acids (FISH OIL) 1000 MG CAPS Take by mouth. (Patient not taking: Reported on 05/16/2023)   [DISCONTINUED] anastrozole  (ARIMIDEX ) 1 MG tablet Take 1 tablet (1 mg total) by mouth daily. (Patient not taking: Reported on 05/16/2023)   No facility-administered encounter  medications on file as of 05/16/2023.     ONCOLOGIC FAMILY HISTORY:  Family History  Problem Relation Age of Onset   Asthma Father    Asthma Sister    Cancer Paternal Aunt        LUNG CANCER   Eczema Neg Hx    Immunodeficiency Neg Hx    Urticaria Neg Hx    Angioedema Neg Hx    Atopy Neg Hx    Colon cancer Neg Hx    Colon polyps Neg Hx      GENETIC COUNSELING/TESTING: N/A  SOCIAL HISTORY:  Deborah Walter is divorced and lives alone in [place] .  She is 1 of 6 children, has a son and daughter both of whom are deceased.  Deborah Walter is currently disabled by back issues.  She smoked cigarettes for >10 years, up to 3 PPD at the most, quit in 2017. Denies alcohol or illicit drug use.     PHYSICAL EXAMINATION:  Vital Signs:   Vitals:   05/16/23 1044  BP: 128/80  Pulse: 84  Resp: 15  Temp: (!) 97.3 F (36.3 C)  SpO2: 97%   Filed Weights   05/16/23 1044  Weight: 248 lb 1.6 oz (112.5 kg)   General: Well-nourished, well-appearing female in no acute distress.  HEENT: Sclerae anicteric.  Lymph: No cervical, supraclavicular, or infraclavicular lymphadenopathy noted on palpation.  Cardiovascular: Regular rate and rhythm. Respiratory: Clear; breathing non-labored.  Neuro: No focal deficits. Steady gait.  Psych: Mood and affect normal and appropriate for situation.  Extremities: No edema. MSK: No focal spinal tenderness to palpation.  Full range of motion in bilateral upper extremities Skin: Warm and dry. Breast exam: S/p bilateral lumpectomy and radiation, lesions completely healed.  Mild hyperpigmentation and striae to both breasts.  No palpable mass or nodularity in either breast or axilla that I could appreciate  LABORATORY DATA:  None for this visit.  DIAGNOSTIC IMAGING:  None for this visit.      ASSESSMENT AND PLAN:  Ms.. Walter is a pleasant 63 y.o. female with bilateral breast cancer diagnosed in 08/2022, treated with bilateral lumpectomy and adjuvant radiation  therapy.  She tried adjuvant anastrozole  but stopped after 1 dose due to severe leg/hip pain.  She presents to the Survivorship Clinic for our initial meeting and routine follow-up post-completion of treatment for breast cancer.   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  She has severe left leg/hip pain after  1 dose of anastrozole  and stopped.    We had a lengthy discussion about antiestrogen therapy.  She is reluctant to consider alternative antiestrogen therapy due to potential side effects and how that would negatively impact quality of life, which she values more than anything.  I have reviewed the potential risk/benefit, side effects, and symptom management for AI's vs tamoxifen . She understands she may not have any side effects.  She understands that the potential benefit of antiestrogen therapy is not guaranteed; does not want to suffer through treatment if there is a chance she may have a cancer recurrence in the future despite therapy. I reviewed differences in recurrence of ductal vs lobular breast cancers. Both of her children have passed away. She has a unique perspective on life and QOL. I validated her feelings. After discussion she agrees to fill a prescription for tamoxifen  and will likely try. If she has any SE's, she will stop and proceed with surveillance alone.   Today, a comprehensive survivorship care plan and treatment summary was reviewed with the patient today detailing her breast cancer diagnosis, treatment course, potential late/long-term effects of treatment, appropriate follow-up care with recommendations for the future, and patient education resources.  A copy of this  summary, along with a letter will be sent to the patient's primary care provider via mail/fax/In Basket message after today's visit.    Bone health:  Given Deborah Walter's postmenopausal status, she is at risk for bone demineralization. DEXA currently scheduled 11/21/2023, will see if we can move this up and get it done sooner at [facility] which is closer to home. In the meantime, she was encouraged to increase her consumption of foods rich in calcium. She is unable to do much weightbearing exercise due to the limitations from her back issues.  Cancer screening:  Due to Deborah Walter's history and her age, she should receive screening for skin cancers, colon cancer, gynecologic cancers, and she qualifies for lung cancer screening CT.  The information and recommendations are listed on the patient's comprehensive care plan/treatment summary and were reviewed in detail with the patient.    Health maintenance and wellness promotion: Deborah Walter was encouraged to consume 5-7 servings of fruits and vegetables per day. She was also encouraged to engage in exercise as able. She was instructed to limit her alcohol consumption and continue to abstain from tobacco use.   Support services/counseling: It is not uncommon for this period of the patient's cancer care trajectory to be one of many emotions and stressors.  We discussed an opportunity for her to participate in the next session of FYNN ("Finding Your New Normal") support group series designed for patients after they have completed treatment.   Deborah Walter was encouraged to take advantage of our many other support services programs, support groups, and/or counseling in coping with her new life as a cancer survivor after completing anti-cancer treatment.  She was offered support today through active listening and expressive supportive counseling.  She was given information regarding our available services and encouraged to contact me with any questions or for help  enrolling in any of our support group/programs.    Dispo:   -Rx: Tamoxifen  20 mg po daily -Return to cancer center in 3 months (if she starts Tamoxifen ) or 6 months (if she does not take Tamoxifen ) -Mammogram due in 08/2023 -Follow up with surgery as indicated -She is welcome to return back to the Survivorship Clinic at any time; no additional follow-up needed at this time.  -  Consider referral back to survivorship as a long-term survivor for continued surveillance   Orders Placed This Encounter  Procedures   CT CHEST LUNG CA SCREEN LOW DOSE W/O CM    Standing Status:   Future    Expected Date:   06/15/2023    Expiration Date:   05/15/2024    Preferred Imaging Location?:   [facility]   MM DIAG BREAST TOMO BILATERAL    Standing Status:   Future    Expected Date:   09/19/2023    Expiration Date:   05/15/2024    Reason for Exam (SYMPTOM  OR DIAGNOSIS REQUIRED):   bilateral breast cancer 08/2022    Preferred imaging location?:   [facility]    A total of (40) minutes of face-to-face time was spent with this patient with greater than 50% of that time in counseling and care-coordination.   Kialee Kham, NP Survivorship Program [facility] [phone]   Note: PRIMARY CARE PROVIDER Hershel Los, [place] [phone]

## 2023-05-26 ENCOUNTER — Other Ambulatory Visit: Payer: Self-pay | Admitting: Nurse Practitioner

## 2023-05-26 ENCOUNTER — Encounter: Payer: Self-pay | Admitting: Nurse Practitioner

## 2023-06-01 ENCOUNTER — Other Ambulatory Visit: Payer: Self-pay | Admitting: Hematology

## 2023-06-01 NOTE — Telephone Encounter (Signed)
 Medication d/c by Lacie Burton, NP on 05/16/2023

## 2023-07-13 ENCOUNTER — Other Ambulatory Visit: Payer: Self-pay

## 2023-07-13 DIAGNOSIS — C50412 Malignant neoplasm of upper-outer quadrant of left female breast: Secondary | ICD-10-CM

## 2023-07-13 NOTE — Progress Notes (Signed)
 Pt requested that Bone Density order be sent to [facility] [phone]  [phone].  Fax sent to attention [place] at [facility].  Fax confirmation received.  Order modified to external.  Original order was sent to [facility] but want to go to [facility] because it's closer to her home.

## 2023-07-14 ENCOUNTER — Other Ambulatory Visit: Payer: Self-pay

## 2023-08-01 ENCOUNTER — Inpatient Hospital Stay: Admission: RE | Admit: 2023-08-01 | Source: Ambulatory Visit

## 2023-08-04 ENCOUNTER — Other Ambulatory Visit: Payer: Self-pay

## 2023-08-04 ENCOUNTER — Telehealth: Payer: Self-pay

## 2023-08-04 NOTE — Telephone Encounter (Signed)
 Pt called stating she missed her appt for her Bone Density test.  Pt stated she has a f/u appt scheduled on 08/08/2023 and wanted to know should she keep this appt since she missed her Bone Density appt.  Pt's mammogram is not due until 09/19/2023.  Stated to keep the 08/08/2023 if she wants or she can reschedule the appts until after she has completed both the Bone Density and Mammogram.  Instructed pt to give our office a return call once she's decided on keeping or cancelling her doctor's appts on 08/08/2023.  Awaiting pt's return call.

## 2023-08-05 ENCOUNTER — Telehealth: Payer: Self-pay

## 2023-08-05 NOTE — Telephone Encounter (Signed)
 Contacted patient via telephone call. Unable to reach the patient/left voice mail @[phone].  Let patient know that if she is not having any issues with the Anastrozole , then per Dr. Lanny it is okay for her have her DEXA scan first and then a follow-up office visit.

## 2023-08-07 NOTE — Assessment & Plan Note (Deleted)
 pT2N0M0, stage IA, invasive lobular carcinoma, ER+/PR+/HER2-, Oncotype RS 0 -diagnaosed in 08/2022 -s/p lumpectomy and SLN biopsy  -completed adjuvant RT on 01/31/2023 -I recommend adjuvant AI.  She started anastrozole  in January 2025, but developed bladder pain and stopped after 1 dose. -She started tamoxifen  in April 2025.

## 2023-08-08 ENCOUNTER — Inpatient Hospital Stay: Payer: BC Managed Care – PPO

## 2023-08-08 ENCOUNTER — Telehealth: Payer: Self-pay | Admitting: Hematology

## 2023-08-08 ENCOUNTER — Inpatient Hospital Stay: Payer: BC Managed Care – PPO | Admitting: Hematology

## 2023-08-08 DIAGNOSIS — C50412 Malignant neoplasm of upper-outer quadrant of left female breast: Secondary | ICD-10-CM

## 2023-08-08 NOTE — Telephone Encounter (Signed)
 Rescheduled appointments per staff message. Talked with the patient and she is aware of the changes made to her upcoming appointments.

## 2023-09-14 ENCOUNTER — Ambulatory Visit
Admission: RE | Admit: 2023-09-14 | Discharge: 2023-09-14 | Disposition: A | Discharge status: Home / Self Care | Source: Ambulatory Visit | Attending: Nurse Practitioner | Admitting: Nurse Practitioner

## 2023-09-14 DIAGNOSIS — Z122 Encounter for screening for malignant neoplasm of respiratory organs: Secondary | ICD-10-CM

## 2023-09-19 ENCOUNTER — Inpatient Hospital Stay: Admitting: Hematology

## 2023-09-19 ENCOUNTER — Inpatient Hospital Stay

## 2023-09-19 NOTE — Assessment & Plan Note (Deleted)
 pT2N0M0, stage IA, invasive lobular carcinoma, ER+/PR+/HER2-, Oncotype RS 0 -diagnaosed in 08/2022 -s/p lumpectomy and SLN biopsy  -completed adjuvant RT on 01/31/2023 -I recommend adjuvant AI

## 2023-10-05 ENCOUNTER — Ambulatory Visit: Admitting: Hematology

## 2023-10-05 ENCOUNTER — Other Ambulatory Visit: Payer: Self-pay

## 2023-10-05 ENCOUNTER — Inpatient Hospital Stay: Attending: Hematology

## 2023-10-05 DIAGNOSIS — C50412 Malignant neoplasm of upper-outer quadrant of left female breast: Secondary | ICD-10-CM

## 2023-10-14 ENCOUNTER — Inpatient Hospital Stay: Admitting: Nurse Practitioner

## 2023-10-24 ENCOUNTER — Telehealth: Payer: Self-pay | Admitting: Nurse Practitioner

## 2023-10-24 NOTE — Telephone Encounter (Signed)
 Deborah Walter called in to re-schedule her missed appointment.

## 2023-10-25 NOTE — Telephone Encounter (Signed)
 Deborah Walter has rescheduled her appointment.

## 2023-10-30 NOTE — Progress Notes (Unsigned)
 [facility]   [phone] [phone]) [phone]    Patient Care Team: Verdie Johann LABOR, DO as PCP - General (Family Medicine) Tyree Nanetta SAILOR, RN as Oncology Nurse Navigator Lanny Callander, MD as Consulting Physician (Hematology) Verleen Stuckey K, NP as Nurse Practitioner (Nurse Practitioner) Vernetta Berg, MD as Consulting Physician (General Surgery) Dewey Rush, MD as Consulting Physician (Radiation Oncology)   CHIEF COMPLAINT: Follow up bilateral breast cancer   Oncology History  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, left (HCC)  09/21/2022 Initial Diagnosis   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, left (HCC)   09/21/2022 Cancer Staging   Staging form: Breast, AJCC 8th Edition - Clinical stage from 09/21/2022: Stage IA (cT1b, cN0, cM0, G2, ER+, PR+, HER2-) - Signed by Lanell Donald Stagger, PA-C on 09/21/2022 Stage prefix: Initial diagnosis Method of lymph node assessment: Clinical Histologic grading system: 3 grade system   12/13/2022 Cancer Staging   Staging form: Breast, AJCC 8th Edition - Pathologic stage from 12/13/2022: Stage IA (pT2, pN0(sn), cM0, G2, ER+, PR+, HER2-, Oncotype DX score: 0) - Signed by Lanell Donald Stagger, PA-C on 12/13/2022 Stage prefix: Initial diagnosis Method of lymph node assessment: Sentinel lymph node biopsy Multigene prognostic tests performed: Oncotype DX Recurrence score range: Less than 11 Histologic grading system: 3 grade system   Primary malignant neoplasm of upper outer quadrant of right breast (HCC)  09/21/2022 Initial Diagnosis   Primary malignant neoplasm of upper outer quadrant of right breast (HCC)   12/13/2022 Cancer Staging   Staging form: Breast, AJCC 8th Edition - Pathologic stage from 12/13/2022: Stage IA (pT1b, pN0(sn), cM0, G1, ER+, PR+, HER2-) - Signed by Lanell Donald Stagger, PA-C on 12/13/2022 Stage prefix: Initial diagnosis Method of lymph  node assessment: Sentinel lymph node biopsy Multigene prognostic tests performed: None Histologic grading system: 3 grade system      CURRENT THERAPY: Tamoxifen   INTERVAL HISTORY   ROS   Past Medical History:  Diagnosis Date   Acid reflux    Arthritis    osteoarthritis   Elevated cholesterol    Family history of adverse reaction to anesthesia    mother- hard to wake her up   H/O seasonal allergies    Hypertension    Urticaria      Past Surgical History:  Procedure Laterality Date   BREAST BIOPSY Right 10/11/2022   MM RT BREAST BX W LOC DEV 1ST LESION IMAGE BX SPEC STEREO GUIDE 10/11/2022 [facility]   BREAST BIOPSY  11/09/2022   US  RT RADIOACTIVE SEED LOC 11/09/2022 [facility] ULTRASOUND   BREAST BIOPSY Left 11/09/2022   US  LT RADIOACTIVE SEED LOC 11/09/2022 [facility] ULTRASOUND   BREAST BIOPSY Left 11/09/2022   US  LT RADIOACTIVE SEED LOC 11/09/2022 [facility] ULTRASOUND   BREAST BIOPSY  11/09/2022   MM LT RADIOACTIVE SEED LOC MAMMO GUIDE 11/09/2022 [facility]   BREAST BIOPSY  11/09/2022   MM RT RADIOACTIVE SEED LOC MAMMO GUIDE 11/09/2022 [facility]   BREAST LUMPECTOMY WITH RADIOACTIVE SEED AND SENTINEL LYMPH NODE BIOPSY Bilateral 11/10/2022   Procedure: BILATERAL RADIOACTIVE SEED GUIDED LUMPECTOMY WITH BILATERAL SENTINEL NODE BIOPSY;  Surgeon: Vernetta Berg, MD;  Location: [facility];  Service: General;  Laterality: Bilateral;  LMA PEC BLOCK   CLAVICLE SURGERY Right Early 1990   COLONOSCOPY WITH PROPOFOL  N/A 02/27/2018   Procedure: COLONOSCOPY WITH PROPOFOL ;  Surgeon: Shaaron Lamar HERO, MD;  Location: [facility];  Service: Endoscopy;  Laterality: N/A;  8:30am   CYSTECTOMY  Early 1990   Bottom of spine   NASAL SEPTOPLASTY W/ TURBINOPLASTY Bilateral 09/01/2015   Procedure: NASAL SEPTOPLASTY WITH TURBINATE REDUCTION;  Surgeon: Daniel Moccasin, MD;  Location: [facility];  Service: ENT;  Laterality: Bilateral;   POLYPECTOMY  02/27/2018    Procedure: POLYPECTOMY;  Surgeon: Shaaron Lamar HERO, MD;  Location: [facility];  Service: Endoscopy;;  hepatic flexure, rectum   SINOSCOPY     SINUS ENDO WITH FUSION Left 09/01/2015   Procedure: LEFT ENDOSCOPIC TOTAL ETHMOIDECTOMY LEFT ENDOSCOPIC  MAXILLARY ANTROSTOMY LEFT ENDOSCOPIC FRONTAL RECESS EXPLORATION;  Surgeon: Daniel Moccasin, MD;  Location:  SURGERY CENTER;  Service: ENT;  Laterality: Left;   TOTAL SHOULDER ARTHROPLASTY Right 11/09/2018   Procedure: TOTAL SHOULDER ARTHROPLASTY;  Surgeon: Dozier Soulier, MD;  Location: [facility];  Service: Orthopedics;  Laterality: Right;   TUBAL LIGATION  1982     Outpatient Encounter Medications as of 11/01/2023  Medication Sig   Ascorbic Acid (VITAMIN C) 125 MG CHEW Chew by mouth.   Biotin 10 MG CAPS Take 10,000 capsules by mouth daily.   Cholecalciferol (VITAMIN D3) 250 MCG (10000 UT) capsule Take 10,000 Units by mouth daily.   ezetimibe (ZETIA) 10 MG tablet Take 10 mg by mouth daily.   fluticasone  (FLONASE ) 50 MCG/ACT nasal spray Place 2 sprays into both nostrils daily.   lisinopril -hydrochlorothiazide  (PRINZIDE ,ZESTORETIC ) 20-25 MG tablet Take 1 tablet by mouth daily.   omeprazole (PRILOSEC) 20 MG capsule Take 20 mg by mouth daily.    oxyCODONE -acetaminophen  (PERCOCET) 5-325 MG tablet Take 1-2 tablets every 4 hours as needed for post operative pain. MAX 6/day   pregabalin (LYRICA) 75 MG capsule Take 1 capsule by mouth 2 (two) times daily.   PROAIR  HFA 108 (90 Base) MCG/ACT inhaler INHALE 2 PUFFS BY MOUTH EVERY 6 HOURS AS NEEDED FOR WHEEZING FOR SHORTNESS OF BREATH   tamoxifen  (NOLVADEX ) 20 MG tablet Take 1 tablet (20 mg total) by mouth daily.   vitamin B-12 (CYANOCOBALAMIN) 100 MCG tablet Take 100 mcg by mouth daily.   No facility-administered encounter medications on file as of 11/01/2023.     There were no vitals filed for this visit. There is no height or weight on file to calculate BMI.   ECOG PERFORMANCE STATUS: {CHL ONC ECOG  PS:[phone]}  PHYSICAL EXAM GENERAL:alert, no distress and comfortable SKIN: no rash  EYES: sclera clear NECK: without mass LYMPH:  no palpable cervical or supraclavicular lymphadenopathy  LUNGS: clear with normal breathing effort HEART: regular rate & rhythm, no lower extremity edema ABDOMEN: abdomen soft, non-tender and normal bowel sounds NEURO: alert & oriented x 3 with fluent speech, no focal motor/sensory deficits Breast exam:  PAC without erythema    CBC    Latest Ref Rng & Units 09/21/2022   12:01 PM 11/10/2018    3:05 AM 02/22/2018    2:03 PM  CBC  WBC 4.0 - 10.5 K/uL 7.4  11.8  6.1   Hemoglobin 12.0 - 15.0 g/dL 86.4  88.0  86.6   Hematocrit 36.0 - 46.0 % 39.5  37.7  42.2   Platelets 150 - 400 K/uL 285  239  265       CMP     Latest Ref Rng & Units 09/21/2022   12:01 PM 11/10/2018    3:05 AM 02/22/2018    2:03 PM  CMP  Glucose 70 - 99 mg/dL 884  864  888   BUN 8 - 23  mg/dL 29  20  20    Creatinine 0.44 - 1.00 mg/dL 8.93  9.09  8.95   Sodium 135 - 145 mmol/L 138  140  136   Potassium 3.5 - 5.1 mmol/L 4.4  5.3  3.8   Chloride 98 - 111 mmol/L 101  105  101   CO2 22 - 32 mmol/L 29  25  28    Calcium 8.9 - 10.3 mg/dL 89.8  9.4  9.0   Total Protein 6.5 - 8.1 g/dL 7.7     Total Bilirubin 0.3 - 1.2 mg/dL 0.6     Alkaline Phos 38 - 126 U/L 80     AST 15 - 41 U/L 26     ALT 0 - 44 U/L 26         ASSESSMENT & PLAN:  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  She has severe left leg/hip pain after 1 dose of anastrozole  and stopped.     PLAN:  No orders of the defined types were placed in this encounter.     All questions were answered. The patient  knows to call the clinic with any problems, questions or concerns. No barriers to learning were detected. I spent *** counseling the patient face to face. The total time spent in the appointment was *** and more than 50% was on counseling, review of test results, and coordination of care.   Hailea Eaglin K Berman Grainger, NP 10/30/2023 10:03 AM

## 2023-11-01 ENCOUNTER — Inpatient Hospital Stay: Attending: Nurse Practitioner

## 2023-11-01 ENCOUNTER — Encounter: Payer: Self-pay | Admitting: Nurse Practitioner

## 2023-11-01 ENCOUNTER — Inpatient Hospital Stay (HOSPITAL_BASED_OUTPATIENT_CLINIC_OR_DEPARTMENT_OTHER): Admitting: Nurse Practitioner

## 2023-11-01 VITALS — BP 140/98 | HR 61 | Temp 98.0°F | Resp 17 | Wt 250.2 lb

## 2023-11-01 DIAGNOSIS — M25559 Pain in unspecified hip: Secondary | ICD-10-CM | POA: Diagnosis not present

## 2023-11-01 DIAGNOSIS — C50411 Malignant neoplasm of upper-outer quadrant of right female breast: Secondary | ICD-10-CM

## 2023-11-01 DIAGNOSIS — Z17411 Hormone receptor positive with human epidermal growth factor receptor 2 negative status: Secondary | ICD-10-CM | POA: Diagnosis not present

## 2023-11-01 DIAGNOSIS — C50412 Malignant neoplasm of upper-outer quadrant of left female breast: Secondary | ICD-10-CM

## 2023-11-01 DIAGNOSIS — Z170 Estrogen receptor positive status [ER+]: Secondary | ICD-10-CM | POA: Insufficient documentation

## 2023-11-01 DIAGNOSIS — R42 Dizziness and giddiness: Secondary | ICD-10-CM | POA: Insufficient documentation

## 2023-11-01 DIAGNOSIS — M549 Dorsalgia, unspecified: Secondary | ICD-10-CM | POA: Insufficient documentation

## 2023-11-01 DIAGNOSIS — Z79899 Other long term (current) drug therapy: Secondary | ICD-10-CM | POA: Diagnosis not present

## 2023-11-01 DIAGNOSIS — R978 Other abnormal tumor markers: Secondary | ICD-10-CM | POA: Diagnosis not present

## 2023-11-01 LAB — CBC WITH DIFFERENTIAL (CANCER CENTER ONLY)
Abs Immature Granulocytes: 0.02 K/uL (ref 0.00–0.07)
Basophils Absolute: 0 K/uL (ref 0.0–0.1)
Basophils Relative: 1 %
Eosinophils Absolute: 0.1 K/uL (ref 0.0–0.5)
Eosinophils Relative: 3 %
HCT: 40 % (ref 36.0–46.0)
Hemoglobin: 13.6 g/dL (ref 12.0–15.0)
Immature Granulocytes: 0 %
Lymphocytes Relative: 30 %
Lymphs Abs: 1.4 K/uL (ref 0.7–4.0)
MCH: 30.8 pg (ref 26.0–34.0)
MCHC: 34 g/dL (ref 30.0–36.0)
MCV: 90.7 fL (ref 80.0–100.0)
Monocytes Absolute: 0.4 K/uL (ref 0.1–1.0)
Monocytes Relative: 8 %
Neutro Abs: 2.8 K/uL (ref 1.7–7.7)
Neutrophils Relative %: 58 %
Platelet Count: 183 K/uL (ref 150–400)
RBC: 4.41 MIL/uL (ref 3.87–5.11)
RDW: 14.2 % (ref 11.5–15.5)
WBC Count: 4.8 K/uL (ref 4.0–10.5)
nRBC: 0 % (ref 0.0–0.2)

## 2023-11-01 LAB — CMP (CANCER CENTER ONLY)
ALT: 34 U/L (ref 0–44)
AST: 30 U/L (ref 15–41)
Albumin: 4.6 g/dL (ref 3.5–5.0)
Alkaline Phosphatase: 72 U/L (ref 38–126)
Anion gap: 7 (ref 5–15)
BUN: 21 mg/dL (ref 8–23)
CO2: 30 mmol/L (ref 22–32)
Calcium: 10.3 mg/dL (ref 8.9–10.3)
Chloride: 104 mmol/L (ref 98–111)
Creatinine: 0.95 mg/dL (ref 0.44–1.00)
GFR, Estimated: >60 mL/min (ref >60)
Glucose, Bld: 100 mg/dL — ABNORMAL HIGH (ref 70–99)
Potassium: 4.2 mmol/L (ref 3.5–5.1)
Sodium: 141 mmol/L (ref 135–145)
Total Bilirubin: 0.5 mg/dL (ref 0.0–1.2)
Total Protein: 7.8 g/dL (ref 6.5–8.1)

## 2023-11-02 ENCOUNTER — Ambulatory Visit: Payer: Self-pay | Admitting: Nurse Practitioner

## 2023-11-02 LAB — CANCER ANTIGEN 27.29: CA 27.29: 37.1 U/mL (ref 0.0–38.6)

## 2023-11-11 ENCOUNTER — Other Ambulatory Visit: Payer: Self-pay

## 2023-11-11 DIAGNOSIS — C50411 Malignant neoplasm of upper-outer quadrant of right female breast: Secondary | ICD-10-CM

## 2023-11-11 DIAGNOSIS — C50412 Malignant neoplasm of upper-outer quadrant of left female breast: Secondary | ICD-10-CM

## 2023-11-14 ENCOUNTER — Inpatient Hospital Stay: Admitting: Hematology

## 2023-11-14 ENCOUNTER — Inpatient Hospital Stay

## 2023-11-14 NOTE — Assessment & Plan Note (Deleted)
 pT2N0M0, stage IA, invasive lobular carcinoma, ER+/PR+/HER2-, Oncotype RS 0 -diagnaosed in 08/2022 -s/p lumpectomy and SLN biopsy  -completed adjuvant RT on 01/31/2023 -I recommend adjuvant AI

## 2023-11-21 ENCOUNTER — Other Ambulatory Visit: Payer: BC Managed Care – PPO

## 2023-11-23 ENCOUNTER — Telehealth: Payer: Self-pay | Admitting: Hematology

## 2023-11-23 NOTE — Telephone Encounter (Signed)
 I was unable to leave voicemail for patient. I have mailed a printed reminder to address on file for follow up with Dr. Lanny per staff message.

## 2023-12-02 ENCOUNTER — Other Ambulatory Visit: Payer: Self-pay

## 2023-12-05 ENCOUNTER — Inpatient Hospital Stay

## 2023-12-05 ENCOUNTER — Inpatient Hospital Stay: Admitting: Hematology

## 2024-02-01 ENCOUNTER — Ambulatory Visit: Admitting: Plastic Surgery

## 2024-11-06 ENCOUNTER — Inpatient Hospital Stay: Attending: Nurse Practitioner | Admitting: Nurse Practitioner

## 2024-11-06 ENCOUNTER — Inpatient Hospital Stay
